# Patient Record
Sex: Female | Born: 1990 | Race: Black or African American | Hispanic: No | Marital: Married | Smoking: Never smoker
Health system: Southern US, Community
[De-identification: ages and names within clinical notes are randomized; demographics above are authoritative.]

## PROBLEM LIST (undated history)

## (undated) DIAGNOSIS — A6000 Herpesviral infection of urogenital system, unspecified: Secondary | ICD-10-CM

## (undated) DIAGNOSIS — F419 Anxiety disorder, unspecified: Secondary | ICD-10-CM

## (undated) DIAGNOSIS — L309 Dermatitis, unspecified: Secondary | ICD-10-CM

## (undated) DIAGNOSIS — O26619 Liver and biliary tract disorders in pregnancy, unspecified trimester: Secondary | ICD-10-CM

## (undated) DIAGNOSIS — N390 Urinary tract infection, site not specified: Secondary | ICD-10-CM

## (undated) DIAGNOSIS — K831 Obstruction of bile duct: Secondary | ICD-10-CM

## (undated) DIAGNOSIS — R519 Headache, unspecified: Secondary | ICD-10-CM

## (undated) DIAGNOSIS — O26649 Intrahepatic cholestasis of pregnancy, unspecified trimester: Secondary | ICD-10-CM

## (undated) HISTORY — PX: NO PAST SURGERIES: SHX2092

---

## 2019-01-01 ENCOUNTER — Ambulatory Visit (HOSPITAL_COMMUNITY): Payer: Medicaid Other | Admitting: Licensed Clinical Social Worker

## 2021-01-21 ENCOUNTER — Encounter (HOSPITAL_COMMUNITY): Payer: Self-pay | Admitting: Emergency Medicine

## 2021-01-21 ENCOUNTER — Inpatient Hospital Stay (HOSPITAL_COMMUNITY)
Admission: EM | Admit: 2021-01-21 | Discharge: 2021-01-21 | Disposition: A | Discharge status: Home / Self Care | Payer: Medicaid Other | Attending: Obstetrics and Gynecology | Admitting: Obstetrics and Gynecology

## 2021-01-21 ENCOUNTER — Other Ambulatory Visit: Payer: Self-pay

## 2021-01-21 ENCOUNTER — Inpatient Hospital Stay (HOSPITAL_COMMUNITY): Payer: Medicaid Other

## 2021-01-21 DIAGNOSIS — Z3A01 Less than 8 weeks gestation of pregnancy: Secondary | ICD-10-CM | POA: Diagnosis not present

## 2021-01-21 DIAGNOSIS — O26899 Other specified pregnancy related conditions, unspecified trimester: Secondary | ICD-10-CM

## 2021-01-21 DIAGNOSIS — O99351 Diseases of the nervous system complicating pregnancy, first trimester: Secondary | ICD-10-CM | POA: Diagnosis not present

## 2021-01-21 DIAGNOSIS — O3680X0 Pregnancy with inconclusive fetal viability, not applicable or unspecified: Secondary | ICD-10-CM

## 2021-01-21 DIAGNOSIS — G43009 Migraine without aura, not intractable, without status migrainosus: Secondary | ICD-10-CM | POA: Insufficient documentation

## 2021-01-21 DIAGNOSIS — Z885 Allergy status to narcotic agent status: Secondary | ICD-10-CM | POA: Insufficient documentation

## 2021-01-21 DIAGNOSIS — R519 Headache, unspecified: Secondary | ICD-10-CM | POA: Diagnosis present

## 2021-01-21 DIAGNOSIS — O418X10 Other specified disorders of amniotic fluid and membranes, first trimester, not applicable or unspecified: Secondary | ICD-10-CM

## 2021-01-21 DIAGNOSIS — O208 Other hemorrhage in early pregnancy: Secondary | ICD-10-CM | POA: Insufficient documentation

## 2021-01-21 DIAGNOSIS — R109 Unspecified abdominal pain: Secondary | ICD-10-CM

## 2021-01-21 HISTORY — DX: Herpesviral infection of urogenital system, unspecified: A60.00

## 2021-01-21 LAB — COMPREHENSIVE METABOLIC PANEL
ALT: 16 U/L (ref 0–44)
AST: 17 U/L (ref 15–41)
Albumin: 4.1 g/dL (ref 3.5–5.0)
Alkaline Phosphatase: 49 U/L (ref 38–126)
Anion gap: 7 (ref 5–15)
BUN: 7 mg/dL (ref 6–20)
CO2: 23 mmol/L (ref 22–32)
Calcium: 9.3 mg/dL (ref 8.9–10.3)
Chloride: 103 mmol/L (ref 98–111)
Creatinine, Ser: 0.74 mg/dL (ref 0.44–1.00)
GFR, Estimated: >60 mL/min (ref >60)
Glucose, Bld: 84 mg/dL (ref 70–99)
Potassium: 3.6 mmol/L (ref 3.5–5.1)
Sodium: 133 mmol/L — ABNORMAL LOW (ref 135–145)
Total Bilirubin: 0.7 mg/dL (ref 0.3–1.2)
Total Protein: 7.2 g/dL (ref 6.5–8.1)

## 2021-01-21 LAB — CBC WITH DIFFERENTIAL/PLATELET
Abs Immature Granulocytes: 0 10*3/uL (ref 0.00–0.07)
Basophils Absolute: 0 10*3/uL (ref 0.0–0.1)
Basophils Relative: 0 %
Eosinophils Absolute: 0.1 10*3/uL (ref 0.0–0.5)
Eosinophils Relative: 1 %
HCT: 34.8 % — ABNORMAL LOW (ref 36.0–46.0)
Hemoglobin: 12.1 g/dL (ref 12.0–15.0)
Immature Granulocytes: 0 %
Lymphocytes Relative: 41 %
Lymphs Abs: 2 10*3/uL (ref 0.7–4.0)
MCH: 33 pg (ref 26.0–34.0)
MCHC: 34.8 g/dL (ref 30.0–36.0)
MCV: 94.8 fL (ref 80.0–100.0)
Monocytes Absolute: 0.5 10*3/uL (ref 0.1–1.0)
Monocytes Relative: 11 %
Neutro Abs: 2.3 10*3/uL (ref 1.7–7.7)
Neutrophils Relative %: 47 %
Platelets: 251 10*3/uL (ref 150–400)
RBC: 3.67 MIL/uL — ABNORMAL LOW (ref 3.87–5.11)
RDW: 11.9 % (ref 11.5–15.5)
WBC: 4.9 10*3/uL (ref 4.0–10.5)
nRBC: 0 % (ref 0.0–0.2)

## 2021-01-21 LAB — URINALYSIS, ROUTINE W REFLEX MICROSCOPIC
Bilirubin Urine: NEGATIVE
Glucose, UA: NEGATIVE mg/dL
Hgb urine dipstick: NEGATIVE
Ketones, ur: NEGATIVE mg/dL
Nitrite: NEGATIVE
Protein, ur: NEGATIVE mg/dL
Specific Gravity, Urine: 1.015 (ref 1.005–1.030)
pH: 6 (ref 5.0–8.0)

## 2021-01-21 LAB — URINALYSIS, MICROSCOPIC (REFLEX): RBC / HPF: NONE SEEN RBC/hpf (ref 0–5)

## 2021-01-21 LAB — WET PREP, GENITAL
Clue Cells Wet Prep HPF POC: NONE SEEN
Sperm: NONE SEEN
Trich, Wet Prep: NONE SEEN
Yeast Wet Prep HPF POC: NONE SEEN

## 2021-01-21 LAB — I-STAT BETA HCG BLOOD, ED (MC, WL, AP ONLY): I-stat hCG, quantitative: >2000 m[IU]/mL — ABNORMAL HIGH (ref <5)

## 2021-01-21 LAB — HCG, QUANTITATIVE, PREGNANCY: hCG, Beta Chain, Quant, S: 4380 m[IU]/mL — ABNORMAL HIGH (ref <5)

## 2021-01-21 LAB — ABO/RH: ABO/RH(D): O POS

## 2021-01-21 LAB — LIPASE, BLOOD: Lipase: 30 U/L (ref 11–51)

## 2021-01-21 LAB — HIV ANTIBODY (ROUTINE TESTING W REFLEX): HIV Screen 4th Generation wRfx: NONREACTIVE

## 2021-01-21 MED ORDER — BUTALBITAL-APAP-CAFFEINE 50-325-40 MG PO TABS
2.0000 | ORAL_TABLET | Freq: Once | ORAL | Status: AC
  Administered 2021-01-21: 2 via ORAL
  Filled 2021-01-21: qty 2

## 2021-01-21 MED ORDER — ONDANSETRON 4 MG PO TBDP
4.0000 mg | ORAL_TABLET | Freq: Once | ORAL | Status: DC
  Filled 2021-01-21: qty 1

## 2021-01-21 MED ORDER — BUTALBITAL-APAP-CAFFEINE 50-325-40 MG PO TABS: 1.0000 | ORAL_TABLET | Freq: Four times a day (QID) | ORAL | 0 refills | Status: DC | PRN

## 2021-01-21 NOTE — ED Triage Notes (Signed)
Pt endorses migraine for 4 days and abd x1 week.

## 2021-01-21 NOTE — MAU Note (Signed)
Gloria Mann is a 30 y.o. at [redacted]w[redacted]d here in MAU reporting: for the past week has had a pulling pain down the middle of her abdomen. Also has had a migraine for the past 4 days. No bleeding or discharge.   LMP: 12/25/2020  Onset of complaint: ongoing  Pain score: abdomen 7/10, head 8/10  Vitals:   01/21/21 1339 01/21/21 1452  BP: 100/69 110/63  Pulse: 99 83  Resp: 16 16  Temp: 99.6 F (37.6 C) 98 F (36.7 C)  SpO2: 100% 99%     Lab orders placed from triage: none

## 2021-01-21 NOTE — ED Provider Notes (Addendum)
Emergency Medicine Provider Triage Evaluation Note  Gloria Mann , a 30 y.o. female  was evaluated in triage.  Pt complains of headache and abdominal pain.  Headache started 4 days ago, comes and goes.  Associated with nausea and worsened by light.  History of similar migraines, this 1 is different and that its across both temples as opposed to just unilateral.  No changes in vision.  Also complains of abdominal pain has been ongoing for 1 week.  Suprapubic, comes and goes.  No urinary symptoms..  Review of Systems  Positive: Headache, abdominal pain Negative: Vision changes, urinary symptoms  Physical Exam  BP 100/69 (BP Location: Left Arm)   Pulse 99   Temp 99.6 F (37.6 C)   Resp 16   SpO2 100%  Gen:   Awake, no distress   Resp:  Normal effort  MSK:   Moves extremities without difficulty  Other:  Abdomen is soft, mild tenderness over the suprapubic area.  Medical Decision Making  Medically screening exam initiated at 1:44 PM.  Appropriate orders placed.  Gloria Mann was informed that the remainder of the evaluation will be completed by another provider, this initial triage assessment does not replace that evaluation, and the importance of remaining in the ED until their evaluation is complete.  Will check abdominal labs.  Classic migraine presentation, no red flags.  Do not think CT is needed.  Patient is pregnant.  We will call MAU.  No vaginal bleeding.   Theron Arista, PA-C 01/21/21 1346    Jacalyn Lefevre, MD 01/21/21 1413    Theron Arista, PA-C 01/21/21 1424    Jacalyn Lefevre, MD 01/21/21 1510

## 2021-01-21 NOTE — MAU Provider Note (Signed)
History     CSN: [id number]  Arrival date and time: 01/21/21 1331   Event Date/Time   First Provider Initiated Contact with Patient 01/21/21 1542      Chief Complaint  Patient presents with   Migraine   Abdominal Pain   HPI  [name] is a 30 y.o. female [phone] @ [redacted]w[redacted]d here in MAU with migraine HA X 5 days. She does have a history of migraines. She has pain in both sides of her temple. The migraine is constant, the pain in her temple only hurts when she touches it. She tried taking sudafed which normally works, and this time it did not work. She currently rates her pain 8/10.   She also reports a pulling pain in the middle of her abdomen. At times the pain radiates down to her pelvis. She rates her abdominal pain 8/10. She has not taken anything for the pain. She has no bleeding. She was transferred her from the ED for workup.   OB History     Gravida  5   Para  2   Term      Preterm  2   AB  2   Living  2      SAB  1   IAB  1   Ectopic      Multiple      Live Births  2           Past Medical History:  Diagnosis Date   Herpes genitalis     Past Surgical History:  Procedure Laterality Date   NO PAST SURGERIES      No family history on file.  Social History   Tobacco Use   Smoking status: Never   Smokeless tobacco: Never  Substance Use Topics   Alcohol use: Not Currently   Drug use: Never    Allergies:  Allergies  Allergen Reactions   Morphine And Related     No medications prior to admission.   Results for orders placed or performed during the hospital encounter of 01/21/21 (from the past 48 hour(s))  CBC with Differential     Status: Abnormal   Collection Time: 01/21/21  1:45 PM  Result Value Ref Range   WBC 4.9 4.0 - 10.5 K/uL   RBC 3.67 (L) 3.87 - 5.11 MIL/uL   Hemoglobin 12.1 12.0 - 15.0 g/dL   HCT 60.1 (L) 09.3 - 23.5 %   MCV 94.8 80.0 - 100.0 fL   MCH 33.0 26.0 - 34.0 pg   MCHC 34.8 30.0 - 36.0 g/dL   RDW 57.3  22.0 - 25.4 %   Platelets 251 150 - 400 K/uL   nRBC 0.0 0.0 - 0.2 %   Neutrophils Relative % 47 %   Neutro Abs 2.3 1.7 - 7.7 K/uL   Lymphocytes Relative 41 %   Lymphs Abs 2.0 0.7 - 4.0 K/uL   Monocytes Relative 11 %   Monocytes Absolute 0.5 0.1 - 1.0 K/uL   Eosinophils Relative 1 %   Eosinophils Absolute 0.1 0.0 - 0.5 K/uL   Basophils Relative 0 %   Basophils Absolute 0.0 0.0 - 0.1 K/uL   Immature Granulocytes 0 %   Abs Immature Granulocytes 0.00 0.00 - 0.07 K/uL    Comment: Performed at [facility], [address], [place], [place] [address]  Comprehensive metabolic panel     Status: Abnormal   Collection Time: 01/21/21  1:45 PM  Result Value Ref Range   Sodium 133 (  L) 135 - 145 mmol/L   Potassium 3.6 3.5 - 5.1 mmol/L   Chloride 103 98 - 111 mmol/L   CO2 23 22 - 32 mmol/L   Glucose, Bld 84 70 - 99 mg/dL    Comment: Glucose reference range applies only to samples taken after fasting for at least 8 hours.   BUN 7 6 - 20 mg/dL   Creatinine, Ser 7.56 0.44 - 1.00 mg/dL   Calcium 9.3 8.9 - 43.3 mg/dL   Total Protein 7.2 6.5 - 8.1 g/dL   Albumin 4.1 3.5 - 5.0 g/dL   AST 17 15 - 41 U/L   ALT 16 0 - 44 U/L   Alkaline Phosphatase 49 38 - 126 U/L   Total Bilirubin 0.7 0.3 - 1.2 mg/dL   GFR, Estimated >29 >51 mL/min    Comment: (NOTE) Calculated using the CKD-EPI Creatinine Equation (2021)    Anion gap 7 5 - 15    Comment: Performed at [facility], [address], [place], [place] [address]  Lipase, blood     Status: None   Collection Time: 01/21/21  1:45 PM  Result Value Ref Range   Lipase 30 11 - 51 U/L    Comment: Performed at [facility], [address] [place], [place] [address]  I-Stat beta hCG blood, ED     Status: Abnormal   Collection Time: 01/21/21  2:09 PM  Result Value Ref Range   I-stat hCG, quantitative >2,000.0 (H) <5 mIU/mL   Comment 3            Comment:   GEST. AGE      CONC.  (mIU/mL)   <=1 WEEK        5 - 50     2 WEEKS       50 -  500     3 WEEKS       100 - 10,000     4 WEEKS     1,000 - 30,000        FEMALE AND NON-PREGNANT FEMALE:     LESS THAN 5 mIU/mL   Urinalysis, Routine w reflex microscopic     Status: Abnormal   Collection Time: 01/21/21  2:21 PM  Result Value Ref Range   Color, Urine YELLOW YELLOW   APPearance CLEAR CLEAR   Specific Gravity, Urine 1.015 1.005 - 1.030   pH 6.0 5.0 - 8.0   Glucose, UA NEGATIVE NEGATIVE mg/dL   Hgb urine dipstick NEGATIVE NEGATIVE   Bilirubin Urine NEGATIVE NEGATIVE   Ketones, ur NEGATIVE NEGATIVE mg/dL   Protein, ur NEGATIVE NEGATIVE mg/dL   Nitrite NEGATIVE NEGATIVE   Leukocytes,Ua SMALL (A) NEGATIVE    Comment: Performed at [facility], [address] [place], [place] [address]  Urinalysis, Microscopic (reflex)     Status: Abnormal   Collection Time: 01/21/21  2:21 PM  Result Value Ref Range   RBC / HPF NONE SEEN 0 - 5 RBC/hpf   WBC, UA 0-5 0 - 5 WBC/hpf   Bacteria, UA FEW (A) NONE SEEN   Squamous Epithelial / LPF 11-20 0 - 5    Comment: Performed at [facility], [address] [place], [place] [address]  hCG, quantitative, pregnancy     Status: Abnormal   Collection Time: 01/21/21  3:48 PM  Result Value Ref Range   hCG, Beta Chain, Quant, S 4,380 (H) <5 mIU/mL    Comment:  GEST. AGE      CONC.  (mIU/mL)   <=1 WEEK        5 - 50     2 WEEKS       50 - 500     3 WEEKS       100 - 10,000     4 WEEKS     1,000 - 30,000     5 WEEKS     3,500 - 115,000   6-8 WEEKS     12,000 - 270,000    12 WEEKS     15,000 - 220,000        FEMALE AND NON-PREGNANT FEMALE:     LESS THAN 5 mIU/mL Performed at [facility], [address] [place], [place] [address]   ABO/Rh     Status: None   Collection Time: 01/21/21  4:00 PM  Result Value Ref Range   ABO/RH(D) O POS    No rh immune globuloin      NOT A RH IMMUNE GLOBULIN CANDIDATE, PT RH POSITIVE Performed at [facility], [address] [place], [place] [address]   Wet prep, genital      Status: Abnormal   Collection Time: 01/21/21  4:04 PM   Specimen: Vaginal  Result Value Ref Range   Yeast Wet Prep HPF POC NONE SEEN NONE SEEN   Trich, Wet Prep NONE SEEN NONE SEEN   Clue Cells Wet Prep HPF POC NONE SEEN NONE SEEN   WBC, Wet Prep HPF POC MANY (A) NONE SEEN   Sperm NONE SEEN     Comment: Performed at [facility], [address], [place], [place] [address]    US OB LESS THAN 14 WEEKS WITH OB TRANSVAGINAL  Result Date: 01/21/2021 CLINICAL DATA:  Leslie Dales pelvic pain for 1 week EXAM: OBSTETRIC <14 WK [place] AND TRANSVAGINAL OB US TECHNIQUE: Both transabdominal and transvaginal ultrasound examinations were performed for complete evaluation of the gestation as well as the maternal uterus, adnexal regions, and pelvic cul-de-sac. Transvaginal technique was performed to assess early pregnancy. COMPARISON:  None. FINDINGS: Intrauterine gestational sac: Single Yolk sac:  Not Visualized. Embryo:  Not Visualized. Cardiac Activity: Not Visualized. MSD: 4.8 mm   5 w   1 d Subchorionic hemorrhage: Moderate subchorionic hemorrhage measuring 2.1 x 1.0 x 0.6 cm. Maternal uterus/adnexae: Right ovary measures 2.5 x 3.5 x 2.0 cm and the left ovary measures 3.2 x 4.0 x 3.1 cm. Corpus luteum cyst left ovary. Trace pelvic free fluid. IMPRESSION: 1. Probable early intrauterine gestational sac, but no yolk sac, fetal pole, or cardiac activity yet visualized. Recommend follow-up quantitative B-HCG levels and follow-up US in 14 days to assess viability. This recommendation follows SRU consensus guidelines: Diagnostic Criteria for Nonviable Pregnancy Early in the First Trimester. Malva Limes Med 2013; 323:5573-22. 2. Moderate subchorionic hemorrhage. Electronically Signed   By: Sharlet Salina M.D.   On: 01/21/2021 17:51     Review of Systems  Gastrointestinal:  Positive for nausea and vomiting.  Neurological:  Positive for headaches.  Physical Exam   Blood pressure 110/63, pulse 83, temperature 98 F (36.7  C), temperature source Oral, resp. rate 16, height 5\' 3"  (1.6 m), weight 60.1 kg, last menstrual period 12/25/2020, SpO2 99 %.  Physical Exam Constitutional:      General: She is not in acute distress.    Appearance: She is well-developed. She is not ill-appearing, toxic-appearing or diaphoretic.  HENT:  Head: Normocephalic.  Abdominal:     Palpations: Abdomen is soft.     Tenderness: There is generalized abdominal tenderness. There is no guarding or rebound.  Skin:    General: Skin is warm.  Neurological:     Mental Status: She is alert and oriented to person, place, and time.     GCS: GCS eye subscore is 4. GCS verbal subscore is 5. GCS motor subscore is 6.     Motor: Motor function is intact.   MAU Course  Procedures None  MDM  Wet prep & GC HIV, CBC, Hcg, ABO US OB transvaginal   Fioricet given 2 tablets, pain is now 0/10  Assessment and Plan   A:  Pregnancy of unknown anatomic location - Plan: Discharge patient  Abdominal pain in pregnancy - Plan: US OB LESS THAN 14 WEEKS WITH OB TRANSVAGINAL, US OB LESS THAN 14 WEEKS WITH OB TRANSVAGINAL, Discharge patient  [redacted] weeks gestation of pregnancy - Plan: Discharge patient  Subchorionic hemorrhage of placenta in first trimester, single or unspecified fetus - Plan: Discharge patient  Migraine without aura and without status migrainosus, not intractable - Plan: Discharge patient    P:  Discharge home in stable condition Return to MAU if symptoms worsen Ectopic precautions Return to MAU Friday evening for Stat quant Pelvic rest Reviewed [place] details with patient.     Duane Lope, NP 01/21/2021 6:29 PM

## 2021-01-22 LAB — GC/CHLAMYDIA PROBE AMP (~~LOC~~) NOT AT ARMC
Chlamydia: NEGATIVE
Comment: NEGATIVE
Comment: NORMAL
Neisseria Gonorrhea: NEGATIVE

## 2021-01-23 ENCOUNTER — Inpatient Hospital Stay (HOSPITAL_COMMUNITY)
Admission: AD | Admit: 2021-01-23 | Discharge: 2021-01-23 | Disposition: A | Discharge status: Home / Self Care | Payer: Medicaid Other | Attending: Obstetrics & Gynecology | Admitting: Obstetrics & Gynecology

## 2021-01-23 DIAGNOSIS — Z3A01 Less than 8 weeks gestation of pregnancy: Secondary | ICD-10-CM | POA: Insufficient documentation

## 2021-01-23 DIAGNOSIS — Z3491 Encounter for supervision of normal pregnancy, unspecified, first trimester: Secondary | ICD-10-CM | POA: Diagnosis not present

## 2021-01-23 DIAGNOSIS — Z3490 Encounter for supervision of normal pregnancy, unspecified, unspecified trimester: Secondary | ICD-10-CM

## 2021-01-23 DIAGNOSIS — O3680X0 Pregnancy with inconclusive fetal viability, not applicable or unspecified: Secondary | ICD-10-CM | POA: Diagnosis not present

## 2021-01-23 DIAGNOSIS — Z3687 Encounter for antenatal screening for uncertain dates: Secondary | ICD-10-CM

## 2021-01-23 LAB — HCG, QUANTITATIVE, PREGNANCY: hCG, Beta Chain, Quant, S: 11423 m[IU]/mL — ABNORMAL HIGH (ref <5)

## 2021-01-23 NOTE — MAU Note (Signed)
PT SAYS SHE WAS HERE ON 12TH- FOR  SHE HAD WENT TO [facility] FOR H/A AND ABD PAIN SENT HER HERE- HAD U/S  TOLD TO  BACK TODAY AFTER 5 - FOR LABS NOW- ABD PAIN IS 5. WAS 8 NO VB

## 2021-01-23 NOTE — MAU Provider Note (Signed)
None     S Ms. [place] Clayburn is a 30 y.o. [phone] patient who presents to MAU today for repeat hCG.  Patient reports some mild cramping, but is otherwise without complaint.  She states she is unsure of her LMP as she has had irregular bleeding for the past 2 months.   O BP 107/65 (BP Location: Right Arm)   Pulse 79   Temp 98.2 F (36.8 C)   Ht 5\' 3"  (1.6 m)   Wt 61.7 kg   LMP 12/25/2020   BMI 24.09 kg/m  Physical Exam Vitals reviewed.  Constitutional:      Appearance: Normal appearance.  HENT:     Head: Normocephalic and atraumatic.  Eyes:     Conjunctiva/sclera: Conjunctivae normal.  Cardiovascular:     Rate and Rhythm: Normal rate.  Pulmonary:     Effort: Pulmonary effort is normal. No respiratory distress.  Musculoskeletal:        General: Normal range of motion.     Cervical back: Normal range of motion.  Neurological:     Mental Status: She is alert and oriented to person, place, and time.  Psychiatric:        Mood and Affect: Mood normal.        Behavior: Behavior normal.        Thought Content: Thought content normal.   Results for orders placed or performed during the hospital encounter of 01/23/21 (from the past 24 hour(s))  hCG, quantitative, pregnancy     Status: Abnormal   Collection Time: 01/23/21  7:23 PM  Result Value Ref Range   hCG, Beta Chain, Quant, S 11,423 (H) <5 mIU/mL     A Medical screening exam complete Labs Complete Appropriate Rise in hCG  P -Patient informed of hCG levels. -Reassured that some mild cramping is anticipated during early stages of pregnancy. -Instructed to monitor. -Discussed need for follow up 01/25/21 in 2-3 weeks. -Outpatient order placed for OBTV US. -Patient given list of community providers to establish [facility].  -Encouraged to call primary office or return to MAU if symptoms worsen or with the onset of new symptoms. -Discharged to home in stable condition.   [facility], CNM 01/23/2021 9:17 PM

## 2021-01-23 NOTE — Discharge Instructions (Signed)
  [facility] Providers          [facility]  [address], [place], [place] [address]  [phone]  [facility]  [address], [place], [place] [address]  [phone]  [facility]  [address], [place], [place] [address]  [phone]  [facility] [place]  [address], [place], [place] [address]  [phone]  [facility]  [address]), [place], [place] [address]  [phone]  [facility] [address], [place], [place] [address] [phone]  [facility]  [address], [place], [place] [address]  [phone]  [facility]  [address], [place], [place] [address]  [phone]  [facility]  [address], [place], [place] [address]  [phone]  [facility]  [address], [place], [place] [address]  [phone]  [facility]  [address], [place], [place] [address]  [phone]  [facility]  [address], [place], [place] [address]  [phone]  [facility]   [address], [place], [place] [address]  [phone]  [facility]  [address], [place], [place] [address]   [phone]  [facility]  [address], [place],  [address]  [phone]         

## 2021-02-04 ENCOUNTER — Inpatient Hospital Stay (HOSPITAL_COMMUNITY): Payer: Medicaid Other

## 2021-02-04 ENCOUNTER — Inpatient Hospital Stay (HOSPITAL_COMMUNITY)
Admission: AD | Admit: 2021-02-04 | Discharge: 2021-02-04 | Disposition: A | Discharge status: Home / Self Care | Payer: Medicaid Other | Attending: Obstetrics and Gynecology | Admitting: Obstetrics and Gynecology

## 2021-02-04 ENCOUNTER — Other Ambulatory Visit: Payer: Self-pay

## 2021-02-04 ENCOUNTER — Encounter (HOSPITAL_COMMUNITY): Payer: Self-pay | Admitting: Obstetrics and Gynecology

## 2021-02-04 DIAGNOSIS — O219 Vomiting of pregnancy, unspecified: Secondary | ICD-10-CM | POA: Diagnosis not present

## 2021-02-04 DIAGNOSIS — Z885 Allergy status to narcotic agent status: Secondary | ICD-10-CM | POA: Diagnosis not present

## 2021-02-04 DIAGNOSIS — O26891 Other specified pregnancy related conditions, first trimester: Secondary | ICD-10-CM | POA: Diagnosis not present

## 2021-02-04 DIAGNOSIS — Z3A01 Less than 8 weeks gestation of pregnancy: Secondary | ICD-10-CM | POA: Insufficient documentation

## 2021-02-04 DIAGNOSIS — F4320 Adjustment disorder, unspecified: Secondary | ICD-10-CM | POA: Diagnosis not present

## 2021-02-04 DIAGNOSIS — O208 Other hemorrhage in early pregnancy: Secondary | ICD-10-CM | POA: Diagnosis not present

## 2021-02-04 DIAGNOSIS — O2341 Unspecified infection of urinary tract in pregnancy, first trimester: Secondary | ICD-10-CM | POA: Diagnosis not present

## 2021-02-04 DIAGNOSIS — O99341 Other mental disorders complicating pregnancy, first trimester: Secondary | ICD-10-CM

## 2021-02-04 DIAGNOSIS — O210 Mild hyperemesis gravidarum: Secondary | ICD-10-CM | POA: Diagnosis not present

## 2021-02-04 HISTORY — DX: Obstruction of bile duct: O26.619

## 2021-02-04 HISTORY — DX: Intrahepatic cholestasis of pregnancy, unspecified trimester: O26.649

## 2021-02-04 HISTORY — DX: Obstruction of bile duct: K83.1

## 2021-02-04 LAB — URINALYSIS, ROUTINE W REFLEX MICROSCOPIC
Bilirubin Urine: NEGATIVE
Glucose, UA: NEGATIVE mg/dL
Hgb urine dipstick: NEGATIVE
Ketones, ur: 20 mg/dL — AB
Nitrite: POSITIVE — AB
Protein, ur: NEGATIVE mg/dL
Specific Gravity, Urine: 1.011 (ref 1.005–1.030)
pH: 6 (ref 5.0–8.0)

## 2021-02-04 LAB — CBC
HCT: 36 % (ref 36.0–46.0)
Hemoglobin: 12.7 g/dL (ref 12.0–15.0)
MCH: 32.6 pg (ref 26.0–34.0)
MCHC: 35.3 g/dL (ref 30.0–36.0)
MCV: 92.3 fL (ref 80.0–100.0)
Platelets: 258 10*3/uL (ref 150–400)
RBC: 3.9 MIL/uL (ref 3.87–5.11)
RDW: 11.9 % (ref 11.5–15.5)
WBC: 6.1 10*3/uL (ref 4.0–10.5)
nRBC: 0 % (ref 0.0–0.2)

## 2021-02-04 LAB — HCG, QUANTITATIVE, PREGNANCY: hCG, Beta Chain, Quant, S: 88678 m[IU]/mL — ABNORMAL HIGH (ref <5)

## 2021-02-04 MED ORDER — SCOPOLAMINE 1 MG/3DAYS TD PT72
1.0000 | MEDICATED_PATCH | TRANSDERMAL | Status: DC
  Administered 2021-02-04: 1.5 mg via TRANSDERMAL
  Filled 2021-02-04: qty 1

## 2021-02-04 MED ORDER — METOCLOPRAMIDE HCL 10 MG PO TABS: 10.0000 mg | ORAL_TABLET | Freq: Four times a day (QID) | ORAL | 1 refills | Status: DC | PRN

## 2021-02-04 MED ORDER — METOCLOPRAMIDE HCL 10 MG PO TABS
10.0000 mg | ORAL_TABLET | Freq: Once | ORAL | Status: AC
  Administered 2021-02-04: 10 mg via ORAL
  Filled 2021-02-04: qty 1

## 2021-02-04 MED ORDER — SCOPOLAMINE 1 MG/3DAYS TD PT72
1.0000 | MEDICATED_PATCH | TRANSDERMAL | 1 refills | Status: DC
Start: 2021-02-07 — End: 2021-04-22

## 2021-02-04 MED ORDER — CEFADROXIL 500 MG PO CAPS: 500.0000 mg | ORAL_CAPSULE | Freq: Two times a day (BID) | ORAL | 0 refills | Status: DC
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
## 2021-02-04 NOTE — Discharge Instructions (Signed)
Psychiatric Services °[facility]  °[address], [place], [place] °[phone] ° °[facility] °[address], [place], [place] °Helpline [phone] or 1-800-711-2635 °www..com/[email or web address]/[email or web address]-health-hospital/ ° °[facility] °Walk-in Mon-Fri, 8:30-5:00 °[address], [place], [place] °[phone] °www.[email or web address].org  °*Bring your own interpreter at 1st visit ° °[facility] °[address], [place], [place] °[phone] °www.[email or web address].com  ° °[facility]  °[address], [place], [place] °[phone] ° °[facility] °Walk-in Mon-Fri, 8am-3pm °[address], [place], [place] °[phone] °www.[facility].org ° °Therapy/Counseling ° °[facility] °[address], [place], [place] °[phone] ° °[facility] °[address], [place], [place] °[phone] ° °[facility] °[address], [place], [place] °[phone] °*pacientes que hablen espanol, favor comunicarse con el Sr. [place], extension 2244 o la Sra Laurecki, extension 3331 para hacer una cita.  ° °[facility] °[address] [place]” °[phone] (Habla Espanol) ° °[facility] °[address], [place], [place] °[phone] ° °[facility] °[address], [place], [place] °[phone] °  °[facility]  °[address], [place], [place] °[phone] °www.[email or web address].org  °*Uninsured and underinsured, ages 19-64 ° °[facility] °[address], [place], [place] °[phone] (Habla Espanol) ° °[facility] °[address], [place], [place] °[phone] (Habla Espanol) ° °[facility] °[address], [place], [place] °[phone] (Habla  Espanol) ° °[facility] °Mon-Thurs 8:30am-8:00pm/ Fri 8:30-7:00pm °[address], [place], [place] °(3rd floor, located at corner of [address]) °Call [phone] to schedule an appointment °[email or web address] ° °[facility] °[address], [place], [place]  °[phone] ° °[facility]  °[address], [place], [place] °336-375-8333 ° °Social Support °[facility] ([facility]) °(336) 373-1402 or www.mhag.org °[address], [place], [place] [address] °* Recovery support and educational programs, including recovery skills classes, support groups, and one-on-one sessions with [place] Certified Peer Support Specialists.  ° ° °NAMI (National Alliance of the Mentally Ill) [place] °NAMI Helpline: (336) 370-4264 °* Family and Friends Support Group/  Contact Jack Glenn at [phone] for more information °* Family to Family Education Program and Basics Class : enroll online or email Mary Pennington at [email or web address]  °* Monthly educational meetings, contact Mitch McGee at [phone] °[email or web address].[email or web address]/  ° ° °24- Hour Availability:  °*[facility] 336-832-9700 or 1-800-711-2635 ° °* [facility] Crisis (Domestic Violence, Rape, Victim Assistance) Line 336-273-7273 ° °* [facility] 1-855-788-8787 or 336-676-6840 ° °* [facility] [place] [facility]  °336-899-1505(8am-4pm only) °1-866-261-5769(after hours) ° °*[facility] Mobile Crisis Unit °1-877-626-1772 ° °*[place] National Suicide Hotline °1-800-273-8255 ° ° °
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
## 2021-02-06 LAB — CULTURE, OB URINE: Culture: >=100000 — AB
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
## 2021-02-12 ENCOUNTER — Ambulatory Visit (INDEPENDENT_AMBULATORY_CARE_PROVIDER_SITE_OTHER): Payer: Medicaid Other | Admitting: Clinical

## 2021-02-12 ENCOUNTER — Other Ambulatory Visit: Payer: Self-pay

## 2021-02-12 DIAGNOSIS — F430 Acute stress reaction: Secondary | ICD-10-CM

## 2021-02-12 DIAGNOSIS — Z658 Other specified problems related to psychosocial circumstances: Secondary | ICD-10-CM

## 2021-02-12 NOTE — Patient Instructions (Signed)
Center for [facility] at [facility] [address] [place], [place] [address] [phone] (main office) [phone] [facility] office)  [email or web address] (view video tour of [facility], etc.)    [facility]  [phone] or [phone] [facility] 24/7  24/7 [facility], [address] [place], [place]  [phone] Fax: [phone] [email or web address] *Interpreters available *Accepts all insurance and uninsured for Urgent Care needs *Accepts Medicaid and uninsured for Outpatient treatment   NAMI [employer] on Mental Illness) [place] NAMI helpline: (639) 233-4480  NAMI [place] helpline: 463-812-3711 https://namiguilford.org  *A community hub for information relating to local resources and services for the friends and families of individuals living alongside a mental health condition, as well as the individuals themselves. Classes and support groups also provided

## 2021-02-16 NOTE — BH Specialist Note (Deleted)
Integrated Behavioral Health via Telemedicine Visit  02/16/2021 [place] [id number]  Number of Integrated Behavioral Health visits: *** Session Start time: 10:15***  Session End time: 10:45*** Total time: {IBH Total Time:21014050}  Referring Provider: *** Patient/Family location: Home*** [facility] Provider location: [facility]  All persons participating in visit: Patient *** and [facility] Hakeem Frazzini ***  Types of Service: {CHL AMB TYPE OF SERVICE:[phone]}  I connected with [place] Jaffee and/or [place] Bostick's {family members:[address]} via  Telephone or Engineer, civil (consulting)  (Video is Surveyor, mining) and verified that I am speaking with the correct person using two identifiers. Discussed confidentiality: {YES/NO:21197}  I discussed the limitations of telemedicine and the availability of in person appointments.  Discussed there is a possibility of technology failure and discussed alternative modes of communication if that failure occurs.  I discussed that engaging in this telemedicine visit, they consent to the provision of behavioral healthcare and the services will be billed under their insurance.  Patient and/or legal guardian expressed understanding and consented to Telemedicine visit: {YES/NO:21197}  Presenting Concerns: Patient and/or family reports the following symptoms/concerns: *** Duration of problem: ***; Severity of problem: {Mild/Moderate/Severe:20260}  Patient and/or Family's Strengths/Protective Factors: {CHL AMB BH PROTECTIVE [phone]}  Goals Addressed: Patient will:  Reduce symptoms of: {IBH Symptoms:21014056}   Increase knowledge and/or ability of: {IBH Patient Tools:21014057}   Demonstrate ability to: {IBH Goals:21014053}  Progress towards Goals: {CHL AMB BH PROGRESS TOWARDS GOALS:[phone]}  Interventions: Interventions utilized:  {IBH  Interventions:21014054} Standardized Assessments completed: {IBH Screening Tools:21014051}  Patient and/or Family Response: ***  Assessment: Patient currently experiencing ***.   Patient may benefit from ***.  Plan: Follow up with behavioral health clinician on : *** Behavioral recommendations: *** Referral(s): {IBH Referrals:21014055}  I discussed the assessment and treatment plan with the patient and/or parent/guardian. They were provided an opportunity to ask questions and all were answered. They agreed with the plan and demonstrated an understanding of the instructions.   They were advised to call back or seek an in-person evaluation if the symptoms worsen or if the condition fails to improve as anticipated.  Valetta Close Julie Paolini, LCSW

## 2021-02-17 ENCOUNTER — Other Ambulatory Visit: Payer: Self-pay | Admitting: *Deleted

## 2021-02-17 DIAGNOSIS — O219 Vomiting of pregnancy, unspecified: Secondary | ICD-10-CM

## 2021-02-17 MED ORDER — PROMETHAZINE HCL 25 MG PO TABS: 25.0000 mg | ORAL_TABLET | Freq: Four times a day (QID) | ORAL | 0 refills | Status: DC | PRN

## 2021-02-17 NOTE — Progress Notes (Signed)
Patient called requesting NOB appointment. Patient informed that she has an upcoming on 03/09/21, however patient did not know about appointment. Appointment rescheduled for 02/23/21 for NOB intake. Patient would be [redacted]w[redacted]d. Genetic testing will not be completed until NOB visit with provider.   Patient reported having a lot of nausea and vomiting with weight loss. The medication that was prescribed per patient is not working. Phenergan 25 mg 1 tab PO every 6 hours PRN sent to pharmacy.   Clovis Pu, RN

## 2021-02-23 ENCOUNTER — Other Ambulatory Visit: Payer: Self-pay

## 2021-02-23 ENCOUNTER — Ambulatory Visit (INDEPENDENT_AMBULATORY_CARE_PROVIDER_SITE_OTHER): Payer: Medicaid Other

## 2021-02-23 VITALS — BP 112/68 | HR 88 | Temp 98.3°F | Wt 129.0 lb

## 2021-02-23 DIAGNOSIS — O09899 Supervision of other high risk pregnancies, unspecified trimester: Secondary | ICD-10-CM | POA: Insufficient documentation

## 2021-02-23 DIAGNOSIS — Z3480 Encounter for supervision of other normal pregnancy, unspecified trimester: Secondary | ICD-10-CM | POA: Insufficient documentation

## 2021-02-23 DIAGNOSIS — Z3A09 9 weeks gestation of pregnancy: Secondary | ICD-10-CM

## 2021-02-23 MED ORDER — BLOOD PRESSURE KIT DEVI: 0 refills | Status: DC

## 2021-02-23 MED ORDER — PREPLUS 27-1 MG PO TABS: 1.0000 | ORAL_TABLET | Freq: Every day | ORAL | 12 refills | Status: AC

## 2021-02-23 NOTE — Progress Notes (Signed)
    I discussed the limitations, risks, security and privacy concerns of performing an evaluation and management service by telephone and the availability of in person appointments. I also discussed with the patient that there may be a patient responsible charge related to this service. The patient expressed understanding and agreed to proceed.   Location: [facility] Patient: clinic Provider: clinic PRENATAL INTAKE SUMMARY  Ms. Pickard presents today New OB Nurse Interview.  OB History     Gravida  5   Para  2   Term  0   Preterm  2   AB  2   Living  2      SAB  1   IAB  1   Ectopic  0   Multiple      Live Births  2        Obstetric Comments  Daughter was born at 17 weeks pt had ICP Son was born at 72 weeks pt had ICP  Both born at [facility] in [place]         I have reviewed the patient's medical, obstetrical, social, and family histories, medications, and available lab results.  SUBJECTIVE She has no unusual complaints  OBJECTIVE Initial Nurse interview for history/labs (New OB)  last menstrual period date: 12/16/2020 EDD: 09/22/2021  GA: [redacted]w[redacted]d G5P0222 FHT: 133  GENERAL APPEARANCE: alert, well appearing, in no apparent distress, oriented to person, place and time   ASSESSMENT Normal pregnancy  PLAN Prenatal care:  [facility]  OB Pnl/HIV/Hep C OB Urine Culture GC/CT will obtain at next apt  PAP was obtained Jan. 2022 Medical records from [facility] to be released to clinic  HgbEval/SMA/CF ([place])-Done at next visit  Panorama-Done at next  A1C OB Ultrasound for anatomy ordered   Blood Pressure Monitor/Weight Scale BP Rx sent to [facility] Weight Scale pt to get one   MyChart/Babyscripts MyChart access verified. I explained pt will have some visits in office and some virtually. Babyscripts instructions given and order placed.   Placed OB Box on problem list and updated   Followup with Raelyn Mora, CNM  on 03/12/2021  Follow Up Instructions:   I discussed the assessment and treatment plan with the patient. The patient was provided an opportunity to ask questions and all were answered. The patient agreed with the plan and demonstrated an understanding of the instructions.   The patient was advised to call back or seek an in-person evaluation if the symptoms worsen or if the condition fails to improve as anticipated.  I provided 40 minutes of  face-to-face time during this encounter.  Gearldine Shown, CMA

## 2021-02-23 NOTE — Patient Instructions (Signed)
 Please remember that if you are not able to keep your appointment to call the office and reschedule, or cancel. If you no-show or cancel with less than 24 hour notice we will charge you, not your insurance a $50 fee.   AREA PEDIATRIC/FAMILY PRACTICE PHYSICIANS  [facility] [address] [place], [place] [address] Phone - [phone]   Fax - [phone]  [facility] [address] [place], [place]  [address] Phone - [phone]   Fax - [phone]  [facility] [address] [place], [place]  [address] Phone - [phone]   Fax - [phone]  [facility] [address] [place], [place]  [address] Phone - [phone]   Fax - [phone]  [facility] [address] [place], [place]  [address] Phone - [phone]   Fax - [phone]  [facility] [address] [place], [place]  [address] Phone - [phone]   Fax - [phone]  [facility] [place] [facility] [address] [place], [place]  [address] Phone - [phone]   Fax - [phone]  [facility] [address] [place], [place]  [address] Phone - [phone]   Fax - [phone]  [facility] [address] [place], [place]  [address] Phone - [phone]   Fax - [phone] [facility] [address] [place], [place]  [address] Phone - [phone]   Fax - [phone]  [facility] [address] [place], [place]  [address] Phone - [phone]   Fax - [phone]  [facility] [address] [place], [place]  [address] Phone - [phone]   Fax - [phone]  [facility] [address] [place], [place]  [address] Phone - [phone]   Fax - [phone]  [facility] [address] [place], [place]  [address] Phone - [phone]  [facility] [address] [place], [place]  [address] Phone - [phone]   Fax - [phone]  [facility] [address] [place], [place]  [address] Phone - [phone]   Fax - [phone]  [facility] [address] [place], [place]  [address] Phone - [phone]   Fax - [phone]  [facility] [address] [place], [place]  [address] Phone - [phone]   Fax - [phone]   [facility] [address] [place], [place]  [address] Phone - [phone]   Fax - [phone]  [facility] [address] [place], [place]  [address] Phone - [phone]   Fax - [phone]  [facility] [address] [place], [place]  [address] Phone - [phone]   Fax - [phone]  [facility] [address] , [place]  [address] Phone - [phone]   Fax - [phone]  [facility] [address] , [place]  [address] Phone - [phone]   Fax - [phone] [facility] [address] [place], [place]  [address] Phone - [phone]   Fax - [phone]  [facility] [address] [place], [place]  [address] Phone - [phone]   Fax - [phone]   [facility] [address] [place],     [address] Phone - [phone]   Fax - [phone]

## 2021-02-24 LAB — HEMOGLOBIN A1C
Est. average glucose Bld gHb Est-mCnc: 91 mg/dL
Hgb A1c MFr Bld: 4.8 % (ref 4.8–5.6)
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
## 2021-02-28 LAB — URINE CULTURE, OB REFLEX

## 2021-02-28 LAB — CULTURE, OB URINE

## 2021-03-04 ENCOUNTER — Other Ambulatory Visit: Payer: Self-pay | Admitting: *Deleted

## 2021-03-04 DIAGNOSIS — O2341 Unspecified infection of urinary tract in pregnancy, first trimester: Secondary | ICD-10-CM

## 2021-03-04 MED ORDER — CEFADROXIL 500 MG PO CAPS: 500.0000 mg | ORAL_CAPSULE | Freq: Two times a day (BID) | ORAL | 0 refills | Status: AC

## 2021-03-12 ENCOUNTER — Encounter: Payer: Medicaid Other | Admitting: Obstetrics and Gynecology

## 2021-03-12 DIAGNOSIS — Z3A12 12 weeks gestation of pregnancy: Secondary | ICD-10-CM

## 2021-03-12 DIAGNOSIS — O09899 Supervision of other high risk pregnancies, unspecified trimester: Secondary | ICD-10-CM

## 2021-03-13 ENCOUNTER — Ambulatory Visit: Payer: Medicaid Other | Admitting: Clinical

## 2021-03-13 DIAGNOSIS — Z658 Other specified problems related to psychosocial circumstances: Secondary | ICD-10-CM

## 2021-03-13 DIAGNOSIS — F430 Acute stress reaction: Secondary | ICD-10-CM

## 2021-03-13 NOTE — Patient Instructions (Signed)
Center for [facility] at [facility] [address] [place], [place] [address] [phone] (main office) [phone] office)   [facility] Department of Social Services  [facility]  [address] [place], [place] [address] [facility] Office [address] [place], [place] [address]   LIEAP (Low Income Energy Assistance Program) LowBlog.nl  [employer] (Low Income Public house manager Program) [email or web address]

## 2021-03-17 ENCOUNTER — Encounter (HOSPITAL_COMMUNITY): Payer: Self-pay | Admitting: Family Medicine

## 2021-03-17 ENCOUNTER — Inpatient Hospital Stay (HOSPITAL_COMMUNITY)
Admission: AD | Admit: 2021-03-17 | Discharge: 2021-03-17 | Disposition: A | Discharge status: Home / Self Care | Payer: Medicaid Other | Attending: Family Medicine | Admitting: Family Medicine

## 2021-03-17 ENCOUNTER — Other Ambulatory Visit: Payer: Self-pay

## 2021-03-17 DIAGNOSIS — Z3A13 13 weeks gestation of pregnancy: Secondary | ICD-10-CM | POA: Diagnosis not present

## 2021-03-17 DIAGNOSIS — O09899 Supervision of other high risk pregnancies, unspecified trimester: Secondary | ICD-10-CM

## 2021-03-17 DIAGNOSIS — H53149 Visual discomfort, unspecified: Secondary | ICD-10-CM | POA: Diagnosis not present

## 2021-03-17 DIAGNOSIS — O99891 Other specified diseases and conditions complicating pregnancy: Secondary | ICD-10-CM | POA: Insufficient documentation

## 2021-03-17 DIAGNOSIS — O99351 Diseases of the nervous system complicating pregnancy, first trimester: Secondary | ICD-10-CM | POA: Insufficient documentation

## 2021-03-17 DIAGNOSIS — O219 Vomiting of pregnancy, unspecified: Secondary | ICD-10-CM | POA: Insufficient documentation

## 2021-03-17 DIAGNOSIS — R1030 Lower abdominal pain, unspecified: Secondary | ICD-10-CM | POA: Insufficient documentation

## 2021-03-17 DIAGNOSIS — Z3480 Encounter for supervision of other normal pregnancy, unspecified trimester: Secondary | ICD-10-CM

## 2021-03-17 DIAGNOSIS — G43009 Migraine without aura, not intractable, without status migrainosus: Secondary | ICD-10-CM | POA: Diagnosis not present

## 2021-03-17 DIAGNOSIS — F1291 Cannabis use, unspecified, in remission: Secondary | ICD-10-CM | POA: Diagnosis not present

## 2021-03-17 DIAGNOSIS — M545 Low back pain, unspecified: Secondary | ICD-10-CM | POA: Diagnosis not present

## 2021-03-17 DIAGNOSIS — R748 Abnormal levels of other serum enzymes: Secondary | ICD-10-CM | POA: Insufficient documentation

## 2021-03-17 DIAGNOSIS — R42 Dizziness and giddiness: Secondary | ICD-10-CM | POA: Insufficient documentation

## 2021-03-17 DIAGNOSIS — O26891 Other specified pregnancy related conditions, first trimester: Secondary | ICD-10-CM | POA: Insufficient documentation

## 2021-03-17 HISTORY — DX: Headache, unspecified: R51.9

## 2021-03-17 LAB — URINALYSIS, MICROSCOPIC (REFLEX)

## 2021-03-17 LAB — COMPREHENSIVE METABOLIC PANEL
ALT: 116 U/L — ABNORMAL HIGH (ref 0–44)
AST: 56 U/L — ABNORMAL HIGH (ref 15–41)
Albumin: 3.6 g/dL (ref 3.5–5.0)
Alkaline Phosphatase: 65 U/L (ref 38–126)
Anion gap: 7 (ref 5–15)
BUN: 5 mg/dL — ABNORMAL LOW (ref 6–20)
CO2: 22 mmol/L (ref 22–32)
Calcium: 9.2 mg/dL (ref 8.9–10.3)
Chloride: 105 mmol/L (ref 98–111)
Creatinine, Ser: 0.63 mg/dL (ref 0.44–1.00)
GFR, Estimated: >60 mL/min (ref >60)
Glucose, Bld: 102 mg/dL — ABNORMAL HIGH (ref 70–99)
Potassium: 3.6 mmol/L (ref 3.5–5.1)
Sodium: 134 mmol/L — ABNORMAL LOW (ref 135–145)
Total Bilirubin: 0.5 mg/dL (ref 0.3–1.2)
Total Protein: 6.7 g/dL (ref 6.5–8.1)

## 2021-03-17 LAB — HEPATITIS PANEL, ACUTE
HCV Ab: NONREACTIVE
Hep A IgM: NONREACTIVE
Hep B C IgM: NONREACTIVE
Hepatitis B Surface Ag: NONREACTIVE

## 2021-03-17 LAB — URINALYSIS, ROUTINE W REFLEX MICROSCOPIC
Bilirubin Urine: NEGATIVE
Glucose, UA: NEGATIVE mg/dL
Hgb urine dipstick: NEGATIVE
Ketones, ur: NEGATIVE mg/dL
Nitrite: NEGATIVE
Protein, ur: NEGATIVE mg/dL
Specific Gravity, Urine: 1.015 (ref 1.005–1.030)
pH: 6.5 (ref 5.0–8.0)

## 2021-03-17 LAB — CBC
HCT: 34.1 % — ABNORMAL LOW (ref 36.0–46.0)
Hemoglobin: 11.8 g/dL — ABNORMAL LOW (ref 12.0–15.0)
MCH: 32.7 pg (ref 26.0–34.0)
MCHC: 34.6 g/dL (ref 30.0–36.0)
MCV: 94.5 fL (ref 80.0–100.0)
Platelets: 231 10*3/uL (ref 150–400)
RBC: 3.61 MIL/uL — ABNORMAL LOW (ref 3.87–5.11)
RDW: 12.5 % (ref 11.5–15.5)
WBC: 6 10*3/uL (ref 4.0–10.5)
nRBC: 0 % (ref 0.0–0.2)

## 2021-03-17 MED ORDER — PROMETHAZINE HCL 25 MG/ML IJ SOLN
25.0000 mg | Freq: Once | INTRAVENOUS | Status: AC
  Administered 2021-03-17: 25 mg via INTRAVENOUS
  Filled 2021-03-17: qty 1

## 2021-03-17 NOTE — BH Specialist Note (Signed)
Integrated Behavioral Health via Telemedicine Visit  03/17/2021 Gloria Mann [id number]  Number of Integrated Behavioral Health visits: 3 Session Start time: 10:23  Session End time: 10:33 Total time:  10  Referring Provider:  Patient/Family location: Home [facility] Provider location: [facility]  All persons participating in visit: Patient Gloria Mann and [facility] Willford Rabideau   Types of Service: Individual psychotherapy and Telephone visit  I connected with Gloria Mann and/or Gloria Mann  n/a  via  Telephone or Engineer, civil (consulting)  (Video is Surveyor, mining) and verified that I am speaking with the correct person using two identifiers. Discussed confidentiality: Yes   I discussed the limitations of telemedicine and the availability of in person appointments.  Discussed there is a possibility of technology failure and discussed alternative modes of communication if that failure occurs.  I discussed that engaging in this telemedicine visit, they consent to the provision of behavioral healthcare and the services will be billed under their insurance.  Patient and/or legal guardian expressed understanding and consented to Telemedicine visit: Yes   Presenting Concerns: Patient and/or family reports the following symptoms/concerns: Fatigue during daytime; looking forward to job interview next week, and sleep improving at night.  Duration of problem: Current pregnancy; Severity of problem: moderate  Patient and/or Family's Strengths/Protective Factors: Social connections, Social and Emotional competence, Concrete supports in place (healthy food, safe environments, etc.), Sense of purpose, and Physical Health (exercise, healthy diet, medication compliance, etc.)  Goals Addressed: Patient will:  Reduce symptoms of: anxiety, depression, and stress   Demonstrate ability to: Increase healthy adjustment to current  life circumstances  Progress towards Goals: Ongoing  Interventions: Interventions utilized:  Supportive Reflection Standardized Assessments completed:  PHQ9/GAD7 given in past two weeks  Patient and/or Family Response: Pt agrees with treatment plan  Assessment: Patient currently experiencing Psychosocial stress.   Patient may benefit from brief therapeutic intervention today.  Plan: Follow up with behavioral health clinician on : Two months; Call Leanora Murin at [phone], as needed. Behavioral recommendations:  -Continue taking prenatal vitamin daily -Continue prioritizing healthy self-coping strategies (adequate sleep nightly, worry time strategy daily) Referral(s): Integrated [employer] (In Clinic)  I discussed the assessment and treatment plan with the patient and/or parent/guardian. They were provided an opportunity to ask questions and all were answered. They agreed with the plan and demonstrated an understanding of the instructions.   They were advised to call back or seek an in-person evaluation if the symptoms worsen or if the condition fails to improve as anticipated.  Rae Lips, LCSW  Depression screen [facility] 2/9 03/26/2021 03/13/2021 02/12/2021  Decreased Interest 1 1 2   Down, Depressed, Hopeless 0 0 2  PHQ - 2 Score 1 1 4   Altered sleeping 2 2 2   Tired, decreased energy 1 2 2   Change in appetite 0 0 1  Feeling bad or failure about yourself  0 0 2  Trouble concentrating 0 0 0  Moving slowly or fidgety/restless 0 0 0  Suicidal thoughts 0 0 0  PHQ-9 Score 4 5 11   Difficult doing work/chores Not difficult at all - -   GAD 7 : Generalized Anxiety Score 03/26/2021 03/13/2021 02/12/2021  Nervous, Anxious, on Edge 0 1 3  Control/stop worrying 0 1 3  Worry too much - different things 0 1 3  Trouble relaxing 1 1 0  Restless 0 0 0  Easily annoyed or irritable 0 0 0  Afraid - awful  might happen 0 0 3  Total GAD 7 Score 1 4 12   Anxiety Difficulty Not  difficult at all - -

## 2021-03-17 NOTE — MAU Provider Note (Signed)
History     CSN: [id number]  Arrival date and time: 03/17/21 3832   Event Date/Time   First Provider Initiated Contact with Patient 03/17/21 1024      Chief Complaint  Patient presents with   Dizziness   Nausea   Emesis   Headache   Back Pain   HPI Gloria Mann is 30 y.o. [id number] at 54w0dwho presents to MAU with chief complaint of migraine. Onset of complaint: three days ago. Pain score is 8/10. Pain is anterior, dull and constant. She prefers to avoid medication and so has not taken medication or tried other treatments for this complaint. She denies aggravating or alleviating factors. She endorses having 3-4 migraines per calendar year. Each migraine typically lasts 4 days.  Patient also c/o generalized lower abdominal pain and low back pain. Abdominal pain is crampy. Back pain is sore, pain score 9/10. She denies aggravating or alleviating factors.   Patient also c/o mild intermittent vomiting. She states this has improved in the past month. She states she "used to smoke" but discontinued use once learning she was pregnancy.  [facility]   OB History     Gravida  5   Para  2   Term  0   Preterm  2   AB  2   Living  2      SAB  1   IAB  1   Ectopic  0   Multiple      Live Births  2        Obstetric Comments  Daughter was born at 331 weekspt had ICP Son was born at 369 weekspt had ICP  [facility]         Past Medical History:  Diagnosis Date   Headache    Herpes genitalis    Intrahepatic cholestasis of pregnancy     Past Surgical History:  Procedure Laterality Date   NO PAST SURGERIES      Family History  Problem Relation Age of Onset   Depression Mother    Cancer Mother        breast   High Cholesterol Mother    Thyroid disease Mother    Hypertension Father    Diabetes Paternal Aunt    Colon cancer Paternal Uncle     Social History   Tobacco Use   Smoking  status: Never   Smokeless tobacco: Never  Vaping Use   Vaping Use: Never used  Substance Use Topics   Alcohol use: Not Currently   Drug use: Not Currently    Types: Marijuana    Comment: last time smoked 12/25/2020    Allergies:  Allergies  Allergen Reactions   Morphine And Related     Medications Prior to Admission  Medication Sig Dispense Refill Last Dose   Prenatal Vit-Fe Fumarate-FA (PREPLUS) 27-1 MG TABS Take 1 tablet by mouth daily. 30 tablet 12 Past Week   Blood Pressure Monitoring (BLOOD PRESSURE KIT) DEVI Z34.90 Use for Bllod pressure Monitoring as needed 1 each 0    metoCLOPramide (REGLAN) 10 MG tablet Take 1 tablet (10 mg total) by mouth every 6 (six) hours as needed for nausea. (Patient not taking: Reported on 02/23/2021) 30 tablet 1    promethazine (PHENERGAN) 25 MG tablet Take 1 tablet (25 mg total) by mouth every 6 (six) hours as needed for nausea or vomiting. (Patient not taking: Reported on 02/23/2021) 30 tablet 0  scopolamine (TRANSDERM-SCOP) 1 MG/3DAYS Place 1 patch (1.5 mg total) onto the skin every 3 (three) days. (Patient not taking: Reported on 02/23/2021) 10 patch 1     Review of Systems  Constitutional:  Positive for fatigue. Negative for appetite change, chills and fever.  Eyes:  Positive for photophobia. Negative for visual disturbance.  Gastrointestinal:  Positive for abdominal pain, nausea and vomiting.  Genitourinary:  Negative for vaginal bleeding, vaginal discharge and vaginal pain.  Musculoskeletal:  Positive for back pain.  Neurological:  Positive for dizziness and headaches.  All other systems reviewed and are negative. Physical Exam   Blood pressure (!) 89/56, pulse 83, temperature 98.3 F (36.8 C), temperature source Oral, resp. rate 17, last menstrual period 12/16/2020, SpO2 98 %.  Physical Exam Vitals and nursing note reviewed.  Constitutional:      Appearance: She is well-developed. She is not ill-appearing.  Cardiovascular:      Rate and Rhythm: Normal rate and regular rhythm.     Heart sounds: Normal heart sounds.  Pulmonary:     Effort: Pulmonary effort is normal.     Breath sounds: Normal breath sounds.  Abdominal:     Palpations: Abdomen is soft.  Skin:    General: Skin is warm and dry.     Capillary Refill: Capillary refill takes less than 2 seconds.  Neurological:     Mental Status: She is alert and oriented to person, place, and time.  Psychiatric:        Mood and Affect: Mood normal.        Speech: Speech normal.        Behavior: Behavior normal.   MAU Course/MDM  Procedures  --Patient sleeping 45 min after Phenergan bolus --Pertinent negatives: vomiting in MAU, elevated WBSc --Hx ICP x 2 previous pregnancies. Elevated liver enzymes today, normal liver labs at [facility]. Discussed with Dr. Kennon Rounds. Will add on Hepatitis Panel, follow in conjunction with prenatal care.  --Abnormal UA with large component of squam epi indicating contaminated  catch. No urinary complaints. No CVAT, hematuria or fever. Will follow in office  Orders Placed This Encounter  Procedures   Urinalysis, Routine w reflex microscopic Urine, Clean Catch   CBC   Comprehensive metabolic panel   Urinalysis, Microscopic (reflex)   Hepatitis panel, acute   Insert peripheral IV   Discharge patient   Patient Vitals for the past 24 hrs:  BP Temp Temp src Pulse Resp SpO2  03/17/21 1248 (!) 89/56 -- -- 83 -- --  03/17/21 1236 (!) 95/48 -- -- 65 -- --  03/17/21 1023 119/74 -- -- (!) 106 -- --  03/17/21 1020 (!) 99/55 -- -- 99 -- 98 %  03/17/21 1019 (!) 106/59 -- -- 87 -- --  03/17/21 1018 (!) 102/59 -- -- 74 -- --  03/17/21 1008 125/61 98.3 F (36.8 C) Oral 92 17 100 %   Results for orders placed or performed during the hospital encounter of 03/17/21 (from the past 24 hour(s))  Urinalysis, Routine w reflex microscopic Urine, Clean Catch     Status: Abnormal   Collection Time: 03/17/21 10:22 AM  Result Value Ref Range   Color,  Urine YELLOW YELLOW   APPearance HAZY (A) CLEAR   Specific Gravity, Urine 1.015 1.005 - 1.030   pH 6.5 5.0 - 8.0   Glucose, UA NEGATIVE NEGATIVE mg/dL   Hgb urine dipstick NEGATIVE NEGATIVE   Bilirubin Urine NEGATIVE NEGATIVE   Ketones, ur NEGATIVE NEGATIVE mg/dL   Protein, ur  NEGATIVE NEGATIVE mg/dL   Nitrite NEGATIVE NEGATIVE   Leukocytes,Ua SMALL (A) NEGATIVE  Urinalysis, Microscopic (reflex)     Status: Abnormal   Collection Time: 03/17/21 10:22 AM  Result Value Ref Range   RBC / HPF 0-5 0 - 5 RBC/hpf   WBC, UA 0-5 0 - 5 WBC/hpf   Bacteria, UA RARE (A) NONE SEEN   Squamous Epithelial / LPF 21-50 0 - 5   Non Squamous Epithelial PRESENT (A) NONE SEEN   Mucus PRESENT    Amorphous Crystal PRESENT   CBC     Status: Abnormal   Collection Time: 03/17/21 10:36 AM  Result Value Ref Range   WBC 6.0 4.0 - 10.5 K/uL   RBC 3.61 (L) 3.87 - 5.11 MIL/uL   Hemoglobin 11.8 (L) 12.0 - 15.0 g/dL   HCT 34.1 (L) 36.0 - 46.0 %   MCV 94.5 80.0 - 100.0 fL   MCH 32.7 26.0 - 34.0 pg   MCHC 34.6 30.0 - 36.0 g/dL   RDW 12.5 11.5 - 15.5 %   Platelets 231 150 - 400 K/uL   nRBC 0.0 0.0 - 0.2 %  Comprehensive metabolic panel     Status: Abnormal   Collection Time: 03/17/21 10:36 AM  Result Value Ref Range   Sodium 134 (L) 135 - 145 mmol/L   Potassium 3.6 3.5 - 5.1 mmol/L   Chloride 105 98 - 111 mmol/L   CO2 22 22 - 32 mmol/L   Glucose, Bld 102 (H) 70 - 99 mg/dL   BUN 5 (L) 6 - 20 mg/dL   Creatinine, Ser 0.63 0.44 - 1.00 mg/dL   Calcium 9.2 8.9 - 10.3 mg/dL   Total Protein 6.7 6.5 - 8.1 g/dL   Albumin 3.6 3.5 - 5.0 g/dL   AST 56 (H) 15 - 41 U/L   ALT 116 (H) 0 - 44 U/L   Alkaline Phosphatase 65 38 - 126 U/L   Total Bilirubin 0.5 0.3 - 1.2 mg/dL   GFR, Estimated >60 >60 mL/min   Anion gap 7 5 - 15  Hepatitis panel, acute     Status: None   Collection Time: 03/17/21 12:44 PM  Result Value Ref Range   Hepatitis B Surface Ag NON REACTIVE NON REACTIVE   HCV Ab NON REACTIVE NON REACTIVE   Hep A  IgM NON REACTIVE NON REACTIVE   Hep B C IgM NON REACTIVE NON REACTIVE   Meds ordered this encounter  Medications   promethazine (PHENERGAN) 25 mg in lactated ringers 1,000 mL infusion   Assessment and Plan  --30 y.o. [id number] at [redacted]w[redacted]d --FHT 168 by Doppler --Pain score 1/10 prior to discharge --Elevated liver enzymes, normal Hepatitis panel --Discharge home in stable condition  F/U --Next appt at [facility] is 03/24/2021  SDarlina Rumpf [place] MSN, CNM 03/17/2021, 4:29 PM

## 2021-03-17 NOTE — MAU Note (Signed)
.  Gloria Mann is a 30 y.o. at [redacted]w[redacted]d here in MAU reporting: has been throwing up for the past x3 days with streaks of blood in it. No diarrhea. Reports migraine for x3 days as well. Has not taken anything for the pain. Also having lower back pain that is worse when she stands. Denies vb, has had an increase in thick white discharge this week.    Vitals:   03/17/21 1008  BP: 125/61  Pulse: 92  Resp: 17  Temp: 98.3 F (36.8 C)  SpO2: 100%     FHT:168 Lab orders placed from triage:  UA

## 2021-03-19 ENCOUNTER — Inpatient Hospital Stay (HOSPITAL_COMMUNITY)
Admission: AD | Admit: 2021-03-19 | Discharge: 2021-03-19 | Disposition: A | Discharge status: Home / Self Care | Payer: Medicaid Other | Attending: Family Medicine | Admitting: Family Medicine

## 2021-03-19 ENCOUNTER — Other Ambulatory Visit: Payer: Self-pay

## 2021-03-19 ENCOUNTER — Inpatient Hospital Stay (HOSPITAL_COMMUNITY): Payer: Medicaid Other

## 2021-03-19 ENCOUNTER — Encounter (HOSPITAL_COMMUNITY): Payer: Self-pay | Admitting: Family Medicine

## 2021-03-19 DIAGNOSIS — G43909 Migraine, unspecified, not intractable, without status migrainosus: Secondary | ICD-10-CM

## 2021-03-19 DIAGNOSIS — O99351 Diseases of the nervous system complicating pregnancy, first trimester: Secondary | ICD-10-CM | POA: Insufficient documentation

## 2021-03-19 DIAGNOSIS — Z3A13 13 weeks gestation of pregnancy: Secondary | ICD-10-CM

## 2021-03-19 LAB — COMPREHENSIVE METABOLIC PANEL
ALT: 106 U/L — ABNORMAL HIGH (ref 0–44)
AST: 48 U/L — ABNORMAL HIGH (ref 15–41)
Albumin: 3.6 g/dL (ref 3.5–5.0)
Alkaline Phosphatase: 60 U/L (ref 38–126)
Anion gap: 7 (ref 5–15)
BUN: 6 mg/dL (ref 6–20)
CO2: 24 mmol/L (ref 22–32)
Calcium: 9.5 mg/dL (ref 8.9–10.3)
Chloride: 101 mmol/L (ref 98–111)
Creatinine, Ser: 0.66 mg/dL (ref 0.44–1.00)
GFR, Estimated: >60 mL/min (ref >60)
Glucose, Bld: 120 mg/dL — ABNORMAL HIGH (ref 70–99)
Potassium: 4 mmol/L (ref 3.5–5.1)
Sodium: 132 mmol/L — ABNORMAL LOW (ref 135–145)
Total Bilirubin: 0.5 mg/dL (ref 0.3–1.2)
Total Protein: 6.6 g/dL (ref 6.5–8.1)

## 2021-03-19 LAB — CBC WITH DIFFERENTIAL/PLATELET
Abs Immature Granulocytes: 0.02 10*3/uL (ref 0.00–0.07)
Basophils Absolute: 0 10*3/uL (ref 0.0–0.1)
Basophils Relative: 0 %
Eosinophils Absolute: 0.1 10*3/uL (ref 0.0–0.5)
Eosinophils Relative: 2 %
HCT: 34.8 % — ABNORMAL LOW (ref 36.0–46.0)
Hemoglobin: 12.3 g/dL (ref 12.0–15.0)
Immature Granulocytes: 0 %
Lymphocytes Relative: 20 %
Lymphs Abs: 1.4 10*3/uL (ref 0.7–4.0)
MCH: 33.8 pg (ref 26.0–34.0)
MCHC: 35.3 g/dL (ref 30.0–36.0)
MCV: 95.6 fL (ref 80.0–100.0)
Monocytes Absolute: 0.6 10*3/uL (ref 0.1–1.0)
Monocytes Relative: 8 %
Neutro Abs: 4.8 10*3/uL (ref 1.7–7.7)
Neutrophils Relative %: 70 %
Platelets: 244 10*3/uL (ref 150–400)
RBC: 3.64 MIL/uL — ABNORMAL LOW (ref 3.87–5.11)
RDW: 12.7 % (ref 11.5–15.5)
WBC: 7 10*3/uL (ref 4.0–10.5)
nRBC: 0 % (ref 0.0–0.2)

## 2021-03-19 MED ORDER — ACETAMINOPHEN-CAFFEINE 500-65 MG PO TABS: 2.0000 | ORAL_TABLET | Freq: Three times a day (TID) | ORAL | 0 refills | Status: DC | PRN

## 2021-03-19 MED ORDER — KETOROLAC TROMETHAMINE 15 MG/ML IJ SOLN
15.0000 mg | Freq: Once | INTRAMUSCULAR | Status: AC
  Administered 2021-03-19: 15 mg via INTRAVENOUS
  Filled 2021-03-19: qty 1

## 2021-03-19 MED ORDER — METOCLOPRAMIDE HCL 5 MG/ML IJ SOLN
10.0000 mg | Freq: Once | INTRAMUSCULAR | Status: AC
  Administered 2021-03-19: 10 mg via INTRAVENOUS
  Filled 2021-03-19: qty 2

## 2021-03-19 MED ORDER — DIPHENHYDRAMINE HCL 50 MG/ML IJ SOLN
25.0000 mg | Freq: Once | INTRAMUSCULAR | Status: AC
  Administered 2021-03-19: 25 mg via INTRAVENOUS
  Filled 2021-03-19: qty 1

## 2021-03-19 MED ORDER — CYCLOBENZAPRINE HCL 10 MG PO TABS: 10.0000 mg | ORAL_TABLET | Freq: Three times a day (TID) | ORAL | 0 refills | Status: DC | PRN

## 2021-03-19 MED ORDER — METOCLOPRAMIDE HCL 10 MG PO TABS: 10.0000 mg | ORAL_TABLET | Freq: Once | ORAL | Status: DC

## 2021-03-19 MED ORDER — LACTATED RINGERS IV BOLUS
1000.0000 mL | Freq: Once | INTRAVENOUS | Status: AC
  Administered 2021-03-19: 1000 mL via INTRAVENOUS

## 2021-03-19 NOTE — MAU Note (Signed)
Gloria Mann is a 30 y.o. at [redacted]w[redacted]d here in MAU reporting: ongoing migraine since previous visit. States the pain is making her dizzy and nauseated. Also having left lower abdominal pain. No bleeding or discharge.  Onset of complaint: ongoing  Pain score: head 10/10, abdominal 6/10  Vitals:   03/19/21 1052  BP: (!) 108/51  Pulse: 93  Resp: 16  Temp: 98.2 F (36.8 C)  SpO2: 98%     Lab orders placed from triage: UA

## 2021-03-19 NOTE — MAU Provider Note (Signed)
History     CSN: [id number]  Arrival date and time: 03/19/21 1025   Event Date/Time   First Provider Initiated Contact with Patient 03/19/21 1104      Chief Complaint  Patient presents with   Abdominal Pain   Headache   Nausea   Dizziness   Gloria Mann is a 30 y.o. [id number] at 10w2dwho presents today with a headache. She was seen for this on 03/17/2021. She states that by the time she got home her headache had returned. She reports that prior to pregnancy she would take Excedrin or sudafed for headaches and they would resolve. She reports that she is also able to sleep off a headache most of time. However, this headache is not getting better and she cannot even sleep because moving her head in certain positions increases the pain.  She reports that she saw a neurologist when she was younger, but never really had a migraine treatment plan.   Headache  This is a new problem. Episode onset: 03/14/2021. The problem occurs constantly. The problem has been unchanged. The pain is located in the Bilateral region. The pain does not radiate. The pain quality is similar to prior headaches. The quality of the pain is described as throbbing. The pain is at a severity of 10/10. The pain is severe. Associated symptoms include dizziness, nausea, photophobia and vomiting. Pertinent negatives include no fever. The symptoms are aggravated by bright light. She has tried acetaminophen and darkened room for the symptoms. The treatment provided no relief (will get brief relief, but then headache returns.).   OB History     Gravida  5   Para  2   Term  0   Preterm  2   AB  2   Living  2      SAB  1   IAB  1   Ectopic  0   Multiple      Live Births  2        Obstetric Comments  Daughter was born at 329 weekspt had ICP Son was born at 369 weekspt had ICP  [facility]         Past Medical History:  Diagnosis Date   Headache    Herpes genitalis     Intrahepatic cholestasis of pregnancy     Past Surgical History:  Procedure Laterality Date   NO PAST SURGERIES      Family History  Problem Relation Age of Onset   Depression Mother    Cancer Mother        breast   High Cholesterol Mother    Thyroid disease Mother    Hypertension Father    Diabetes Paternal Aunt    Colon cancer Paternal Uncle     Social History   Tobacco Use   Smoking status: Never   Smokeless tobacco: Never  Vaping Use   Vaping Use: Never used  Substance Use Topics   Alcohol use: Not Currently   Drug use: Not Currently    Types: Marijuana    Comment: last time smoked 12/25/2020    Allergies:  Allergies  Allergen Reactions   Morphine And Related     Medications Prior to Admission  Medication Sig Dispense Refill Last Dose   Blood Pressure Monitoring (BLOOD PRESSURE KIT) DEVI Z34.90 Use for Bllod pressure Monitoring as needed 1 each 0    metoCLOPramide (REGLAN) 10 MG tablet Take 1 tablet (10 mg total) by  mouth every 6 (six) hours as needed for nausea. (Patient not taking: Reported on 02/23/2021) 30 tablet 1    Prenatal Vit-Fe Fumarate-FA (PREPLUS) 27-1 MG TABS Take 1 tablet by mouth daily. 30 tablet 12    promethazine (PHENERGAN) 25 MG tablet Take 1 tablet (25 mg total) by mouth every 6 (six) hours as needed for nausea or vomiting. (Patient not taking: Reported on 02/23/2021) 30 tablet 0    scopolamine (TRANSDERM-SCOP) 1 MG/3DAYS Place 1 patch (1.5 mg total) onto the skin every 3 (three) days. (Patient not taking: Reported on 02/23/2021) 10 patch 1     Review of Systems  Constitutional:  Negative for chills and fever.  Eyes:  Positive for photophobia.  Respiratory: Negative.    Cardiovascular: Negative.   Gastrointestinal:  Positive for nausea and vomiting.  Endocrine: Negative.   Genitourinary: Negative.   Musculoskeletal: Negative.   Skin: Negative.   Allergic/Immunologic: Negative.   Neurological:  Positive for dizziness and headaches.   Hematological: Negative.   Psychiatric/Behavioral: Negative.    Physical Exam   Blood pressure (!) 108/51, pulse 93, temperature 98.2 F (36.8 C), temperature source Oral, resp. rate 16, height _0  (1.6 m), weight 60.2 kg, last menstrual period 12/16/2020, SpO2 98 %.  Physical Exam Vitals and nursing note reviewed.  HENT:     Head: Normocephalic.  Cardiovascular:     Rate and Rhythm: Normal rate.  Pulmonary:     Effort: Pulmonary effort is normal.  Abdominal:     Palpations: Abdomen is soft.     Tenderness: There is no abdominal tenderness.  Skin:    General: Skin is warm and dry.  Neurological:     Mental Status: She is alert and oriented to person, place, and time.  Psychiatric:        Mood and Affect: Mood normal.        Behavior: Behavior normal.   FHT 160 with doppler   Results for orders placed or performed during the hospital encounter of 03/19/21 (from the past 24 hour(s))  CBC with Differential/Platelet     Status: Abnormal   Collection Time: 03/19/21 11:31 AM  Result Value Ref Range   WBC 7.0 4.0 - 10.5 K/uL   RBC 3.64 (L) 3.87 - 5.11 MIL/uL   Hemoglobin 12.3 12.0 - 15.0 g/dL   HCT 34.8 (L) 36.0 - 46.0 %   MCV 95.6 80.0 - 100.0 fL   MCH 33.8 26.0 - 34.0 pg   MCHC 35.3 30.0 - 36.0 g/dL   RDW 12.7 11.5 - 15.5 %   Platelets 244 150 - 400 K/uL   nRBC 0.0 0.0 - 0.2 %   Neutrophils Relative % 70 %   Neutro Abs 4.8 1.7 - 7.7 K/uL   Lymphocytes Relative 20 %   Lymphs Abs 1.4 0.7 - 4.0 K/uL   Monocytes Relative 8 %   Monocytes Absolute 0.6 0.1 - 1.0 K/uL   Eosinophils Relative 2 %   Eosinophils Absolute 0.1 0.0 - 0.5 K/uL   Basophils Relative 0 %   Basophils Absolute 0.0 0.0 - 0.1 K/uL   Immature Granulocytes 0 %   Abs Immature Granulocytes 0.02 0.00 - 0.07 K/uL  Comprehensive metabolic panel     Status: Abnormal   Collection Time: 03/19/21 11:31 AM  Result Value Ref Range   Sodium 132 (L) 135 - 145 mmol/L   Potassium 4.0 3.5 - 5.1 mmol/L   Chloride 101  98 - 111 mmol/L   CO2 24 22 -  32 mmol/L   Glucose, Bld 120 (H) 70 - 99 mg/dL   BUN 6 6 - 20 mg/dL   Creatinine, Ser 0.66 0.44 - 1.00 mg/dL   Calcium 9.5 8.9 - 10.3 mg/dL   Total Protein 6.6 6.5 - 8.1 g/dL   Albumin 3.6 3.5 - 5.0 g/dL   AST 48 (H) 15 - 41 U/L   ALT 106 (H) 0 - 44 U/L   Alkaline Phosphatase 60 38 - 126 U/L   Total Bilirubin 0.5 0.3 - 1.2 mg/dL   GFR, Estimated >60 >60 mL/min   Anion gap 7 5 - 15   CT HEAD WO CONTRAST (5MM)  Result Date: 03/19/2021 CLINICAL DATA:  Headache. EXAM: CT HEAD WITHOUT CONTRAST TECHNIQUE: Contiguous axial images were obtained from the base of the skull through the vertex without intravenous contrast. COMPARISON:  None. FINDINGS: Brain: No evidence of acute infarction, hemorrhage, hydrocephalus, extra-axial collection or mass lesion/mass effect. Vascular: No hyperdense vessel or unexpected calcification. Skull: Normal. Negative for fracture or focal lesion. Sinuses/Orbits: No acute finding. Other: None. IMPRESSION: No acute intracranial abnormality seen. Electronically Signed   By: Marijo Conception M.D.   On: 03/19/2021 12:01     MAU Course  Procedures  MDM Patient has had LR bolus, toradol, benadryl and reglan. She reports that she is feeling better. Pain is now 0/10 at this time.   Assessment and Plan   1. Migraine without status migrainosus, not intractable, unspecified migraine type   2. [redacted] weeks gestation of pregnancy    DC home Comfort measures reviewed  2nd Trimester precautions  RX: tylenol/caffeine tabs PRN, Flexeril PRN. Patient has reglan rx already. Encouraged patient to use these medications as needed Return to MAU as needed FU with OB as planned OB office can consider neuro referral if unable to treat with meds provided    Follow-up Information     [facility] Follow up.   Specialty: Obstetrics and Gynecology Contact information: [place]  [id number] [place] DNP, CNM  03/19/21  1:12 PM

## 2021-03-26 ENCOUNTER — Other Ambulatory Visit (HOSPITAL_COMMUNITY)
Admission: RE | Admit: 2021-03-26 | Discharge: 2021-03-26 | Disposition: A | Discharge status: Home / Self Care | Payer: Medicaid Other | Source: Ambulatory Visit | Attending: Obstetrics and Gynecology | Admitting: Obstetrics and Gynecology

## 2021-03-26 ENCOUNTER — Ambulatory Visit (INDEPENDENT_AMBULATORY_CARE_PROVIDER_SITE_OTHER): Payer: Medicaid Other | Admitting: Obstetrics and Gynecology

## 2021-03-26 ENCOUNTER — Other Ambulatory Visit: Payer: Self-pay

## 2021-03-26 ENCOUNTER — Encounter: Payer: Self-pay | Admitting: Obstetrics and Gynecology

## 2021-03-26 VITALS — BP 99/62 | HR 82 | Temp 97.6°F | Wt 136.6 lb

## 2021-03-26 DIAGNOSIS — O26899 Other specified pregnancy related conditions, unspecified trimester: Secondary | ICD-10-CM | POA: Insufficient documentation

## 2021-03-26 DIAGNOSIS — N898 Other specified noninflammatory disorders of vagina: Secondary | ICD-10-CM | POA: Insufficient documentation

## 2021-03-26 DIAGNOSIS — G43909 Migraine, unspecified, not intractable, without status migrainosus: Secondary | ICD-10-CM

## 2021-03-26 DIAGNOSIS — O0992 Supervision of high risk pregnancy, unspecified, second trimester: Secondary | ICD-10-CM

## 2021-03-26 DIAGNOSIS — Z8759 Personal history of other complications of pregnancy, childbirth and the puerperium: Secondary | ICD-10-CM | POA: Diagnosis not present

## 2021-03-26 DIAGNOSIS — Z3A14 14 weeks gestation of pregnancy: Secondary | ICD-10-CM | POA: Diagnosis not present

## 2021-03-26 DIAGNOSIS — Z3480 Encounter for supervision of other normal pregnancy, unspecified trimester: Secondary | ICD-10-CM | POA: Insufficient documentation

## 2021-03-26 DIAGNOSIS — Z8719 Personal history of other diseases of the digestive system: Secondary | ICD-10-CM

## 2021-03-26 MED ORDER — URSODIOL 500 MG PO TABS: 500.0000 mg | ORAL_TABLET | Freq: Two times a day (BID) | ORAL | 3 refills | Status: DC

## 2021-03-26 NOTE — Progress Notes (Signed)
INITIAL OBSTETRICAL VISIT Patient name: Gloria Mann MRN [id number]  Date of birth: Sep 19, 1990 Chief Complaint:   Initial Prenatal Visit  History of Present Illness:   Gloria Oros is a 30 y.o. [phone] African American female at [redacted]w[redacted]d by LMP with an Estimated Date of Delivery: 09/22/21 being seen today for her initial obstetrical visit.  Her obstetrical history is significant for  h/o ICP with last 2 pregnancies, PTB (1st at 33 wks in 2014 and 2nd @ 35 wks in 2017). She reports she has "already started itching like crazy." She reports the itching started at 14 wks in her last 2 pregnancies . This is a planned pregnancy. She and the father of the baby (FOB)/spouse "Alvester Chou"  live together. She has a support system that consists of her spouse/family/friends. Today she reports  milder headache and "itching like crazy" .   Patient's last menstrual period was 12/16/2020 (exact date). Last pap unknown pt reports last pap was in 04/2020, but no records were found at facility she reports having pap in. Results were:  unknown Review of Systems:   Pertinent items are noted in HPI Denies cramping/contractions, leakage of fluid, vaginal bleeding, abnormal vaginal discharge w/ itching/odor/irritation, headaches, visual changes, shortness of breath, chest pain, abdominal pain, severe nausea/vomiting, or problems with urination or bowel movements unless otherwise stated above.  Pertinent History Reviewed:  Reviewed past medical,surgical, social, obstetrical and family history.  Reviewed problem list, medications and allergies. OB History  Gravida Para Term Preterm AB Living  5 2 0 2 2 2   SAB IAB Ectopic Multiple Live Births  1 1 0   2    # Outcome Date GA Lbr Len/2nd Weight Sex Delivery Anes PTL Lv  5 Current           4 Preterm 08/30/15 [redacted]w[redacted]d   M Vag-Spont   LIV  3 IAB 2015          2 Preterm 01/13/13 [redacted]w[redacted]d   F Vag-Spont   LIV  1 SAB 2013            Obstetric Comments  Daughter was born at 8 weeks pt  had ICP  Son was born at 31 weeks pt had ICP   Both born at [facility] in [place]    Physical Assessment:   Vitals:   03/26/21 0826  BP: 99/62  Pulse: 82  Temp: 97.6 F (36.4 C)  Weight: 136 lb 9.6 oz (62 kg)  Body mass index is 24.2 kg/m.       Physical Examination:  General appearance - well appearing, and in no distress  Mental status - alert, oriented to person, place, and time  Psych:  She has a normal mood and affect  Skin - warm and dry, normal color, no suspicious lesions noted  Chest - effort normal, all lung fields clear to auscultation bilaterally  Heart - normal rate and regular rhythm  Abdomen - soft, nontender  Extremities:  No swelling or varicosities noted  Pelvic - VULVA: normal appearing vulva with no masses, tenderness or lesions  VAGINA: normal appearing vagina with normal color and discharge, no lesions.   CERVIX: normal appearing cervix without discharge or lesions, no CMT  Thin prep pap is done with reflex HR HPV cotesting   FHTs by doppler: 161 bpm  Assessment & Plan:  1) HIgh-Risk Pregnancy [id number] at [redacted]w[redacted]d with an Estimated Date of Delivery: 09/22/21   2) Initial OB visit - Welcomed to practice and introduced self to patient  in addition to discussing other advanced practice providers that she may be seeing at this practice - Congratulated patient - Anticipatory guidance on upcoming appointments - Educated on COVID19 and pregnancy and the integration of virtual appointments  - Educated on babyscripts app- patient reports she has not received email, encouraged to look in spam folder and to call office if she still has not received email - patient verbalizes understanding    3) Supervision of high risk pregnancy in second trimester - Obstetric Panel, Including HIV - Hepatitis C antibody - Cervicovaginal ancillary only( [place]) - Cytology - PAP( [place]) - Genetic Screening  4) History of cholestasis during pregnancy - Bile acids,  total - CMP done on 03/19/21 >> AST = 48, ALT = 106 - Rx for ursodiol (ACTIGALL) 500 MG tablet; Take 1 tablet (500 mg total) by mouth 2 (two) times daily.  Dispense: 60 tablet; Refill: 3 - Consult MFM when lab results return  5) [redacted] weeks gestation of pregnancy - Obstetric Panel, Including HIV - Hepatitis C antibody - Cervicovaginal ancillary only( [place]) - Genetic Screening  6) Vaginal discharge during pregnancy, antepartum - Cervicovaginal ancillary only( [place])  7) Migraine without status migrainosus, not intractable, unspecified migraine type - Ambulatory referral to Neurology    Meds:  Meds ordered this encounter  Medications   ursodiol (ACTIGALL) 500 MG tablet    Sig: Take 1 tablet (500 mg total) by mouth 2 (two) times daily.    Dispense:  60 tablet    Refill:  3    Can substitute 300 mg caps po tid (quantity 90 with three refills) if 500 mg tabs are not available    Order Specific Question:   Supervising Provider    Answer:   Reva Bores [[id number]]    Initial labs obtained Continue prenatal vitamins Reviewed n/v relief measures and warning s/s to report Reviewed recommended weight gain based on pre-gravid BMI Encouraged well-balanced diet Genetic Screening discussed: ordered Cystic fibrosis, SMA, Fragile X screening discussed ordered The nature of [facility] with multiple MDs and other Advanced Practice Providers was explained to patient; also emphasized that residents, students are part of our team.  Discussed optimized OB schedule and video visits. Advised can have an in-office visit whenever she feels she needs to be seen. Discussed that her care would be co-managed with MFM. If is is determined that her pregnancy is too high-risk for this office, we will transfer her to one of our high risk OB offices.  Does have own BP cuff. Check BP weekly, let us know if >140/90. Advised to call during normal business hours and there  is an after-hours nurse line available.    Follow-up: Return in about 4 weeks (around 04/23/2021) for Return OB visit - AFP.   Orders Placed This Encounter  Procedures   Bile acids, total   Obstetric Panel, Including HIV   Hepatitis C antibody   Genetic Screening   Ambulatory referral to Neurology    Raelyn Mora MSN, CNM 03/26/2021

## 2021-03-27 ENCOUNTER — Ambulatory Visit: Payer: Medicaid Other | Admitting: Clinical

## 2021-03-27 DIAGNOSIS — Z658 Other specified problems related to psychosocial circumstances: Secondary | ICD-10-CM

## 2021-03-27 LAB — OBSTETRIC PANEL, INCLUDING HIV
Antibody Screen: NEGATIVE
Basophils Absolute: 0 10*3/uL (ref 0.0–0.2)
Basos: 0 %
EOS (ABSOLUTE): 0.1 10*3/uL (ref 0.0–0.4)
Eos: 2 %
HIV Screen 4th Generation wRfx: NONREACTIVE
Hematocrit: 34.1 % (ref 34.0–46.6)
Hemoglobin: 11.7 g/dL (ref 11.1–15.9)
Hepatitis B Surface Ag: NEGATIVE
Immature Grans (Abs): 0 10*3/uL (ref 0.0–0.1)
Immature Granulocytes: 0 %
Lymphocytes Absolute: 1.5 10*3/uL (ref 0.7–3.1)
Lymphs: 23 %
MCH: 32.8 pg (ref 26.6–33.0)
MCHC: 34.3 g/dL (ref 31.5–35.7)
MCV: 96 fL (ref 79–97)
Monocytes Absolute: 0.6 10*3/uL (ref 0.1–0.9)
Monocytes: 9 %
Neutrophils Absolute: 4.3 10*3/uL (ref 1.4–7.0)
Neutrophils: 66 %
Platelets: 243 10*3/uL (ref 150–450)
RBC: 3.57 x10E6/uL — ABNORMAL LOW (ref 3.77–5.28)
RDW: 12.8 % (ref 11.7–15.4)
RPR Ser Ql: NONREACTIVE
Rh Factor: POSITIVE
Rubella Antibodies, IGG: 3.86 index (ref >0.99)
WBC: 6.6 10*3/uL (ref 3.4–10.8)

## 2021-03-27 LAB — HEPATITIS C ANTIBODY: Hep C Virus Ab: 0.1 s/co ratio (ref 0.0–0.9)

## 2021-03-27 NOTE — Patient Instructions (Signed)
[facility] [address] [place], [place] [address] [phone] (main office) [phone] (Mashell Sieben's office)   

## 2021-03-28 LAB — BILE ACIDS, TOTAL: Bile Acids Total: 5.9 umol/L (ref 0.0–10.0)

## 2021-03-30 LAB — CERVICOVAGINAL ANCILLARY ONLY
Bacterial Vaginitis (gardnerella): POSITIVE — AB
Candida Glabrata: NEGATIVE
Candida Vaginitis: NEGATIVE
Chlamydia: NEGATIVE
Comment: NEGATIVE
Comment: NEGATIVE
Comment: NEGATIVE
Comment: NEGATIVE
Comment: NEGATIVE
Comment: NORMAL
Neisseria Gonorrhea: NEGATIVE
Trichomonas: NEGATIVE

## 2021-03-31 LAB — CYTOLOGY - PAP: Diagnosis: NEGATIVE

## 2021-04-07 ENCOUNTER — Other Ambulatory Visit: Payer: Self-pay | Admitting: *Deleted

## 2021-04-07 DIAGNOSIS — O0992 Supervision of high risk pregnancy, unspecified, second trimester: Secondary | ICD-10-CM

## 2021-04-07 DIAGNOSIS — B9689 Other specified bacterial agents as the cause of diseases classified elsewhere: Secondary | ICD-10-CM

## 2021-04-07 DIAGNOSIS — O09899 Supervision of other high risk pregnancies, unspecified trimester: Secondary | ICD-10-CM

## 2021-04-07 DIAGNOSIS — Z3480 Encounter for supervision of other normal pregnancy, unspecified trimester: Secondary | ICD-10-CM

## 2021-04-07 DIAGNOSIS — Z658 Other specified problems related to psychosocial circumstances: Secondary | ICD-10-CM

## 2021-04-07 DIAGNOSIS — Z8759 Personal history of other complications of pregnancy, childbirth and the puerperium: Secondary | ICD-10-CM

## 2021-04-07 MED ORDER — METRONIDAZOLE 500 MG PO TABS
500.0000 mg | ORAL_TABLET | Freq: Two times a day (BID) | ORAL | 0 refills | Status: DC
Start: 2021-04-07 — End: 2021-04-22

## 2021-04-07 NOTE — Progress Notes (Signed)
Ultrasound needed to change from complete to detail due to history of cholestasis.  Clovis Pu, RN

## 2021-04-12 NOTE — L&D Delivery Note (Signed)
OB/GYN Faculty Practice Delivery Note ? ?Gloria Mann is a 31 y.o. [id number] s/p VD at [redacted]w[redacted]d. She was admitted for IOL due to cholestasis with bile acids 123.  ? ?ROM: 4h 64m with clear fluid ?GBS Status: POSITIVE/-- (05/16 1745) ?Maximum Maternal Temperature: 98.85F ? ?Labor Progress: ?Initial SVE: 3/50/-3. She then progressed to complete with assistance of cytotec and AROM.   ? ?Delivery Date/Time: 1117  ?Delivery: Called to room and patient was complete. Pushed for approximately 15 minutes. FOB assisted with delivery of infant. Head delivered L OA. No nuchal cord present. Shoulder and body delivered in usual fashion. Infant with spontaneous cry, placed on mother's abdomen, dried and stimulated. Cord clamped x 2 after 1-minute delay, and cut by FOB. Cord blood drawn. Placenta delivered spontaneously with gentle cord traction. Fundus firm with massage, lower uterine sweep, and Pitocin. Labia, perineum, vagina, and cervix inspected with no lacerations. I&O cath performed with 50 cc clear urine.  ?Baby Weight: pending ? ?Placenta: 3 vessel, intact. Sent to L&D ?Complications: None ?Lacerations: None ?EBL: 75 mL ?Analgesia: Epidural  ? ?Infant:  ?APGAR (1 MIN): 7 ?APGAR (5 MINS): 9 ? ?Leticia Penna, DO  ?[facility] Fellow, Faculty Practice ?Center for [employer], [facility] ?08/26/2021, 11:41 AM ? ? ? ?  ?

## 2021-04-22 ENCOUNTER — Encounter (HOSPITAL_COMMUNITY): Payer: Self-pay | Admitting: Obstetrics & Gynecology

## 2021-04-22 ENCOUNTER — Inpatient Hospital Stay (HOSPITAL_BASED_OUTPATIENT_CLINIC_OR_DEPARTMENT_OTHER): Payer: Medicaid Other

## 2021-04-22 ENCOUNTER — Inpatient Hospital Stay (HOSPITAL_COMMUNITY)
Admission: AD | Admit: 2021-04-22 | Discharge: 2021-04-22 | Disposition: A | Discharge status: Home / Self Care | Payer: Medicaid Other | Attending: Obstetrics & Gynecology | Admitting: Obstetrics & Gynecology

## 2021-04-22 ENCOUNTER — Other Ambulatory Visit (HOSPITAL_COMMUNITY)
Admission: RE | Admit: 2021-04-22 | Discharge: 2021-04-22 | Disposition: A | Discharge status: Home / Self Care | Payer: Medicaid Other | Source: Ambulatory Visit | Attending: Certified Nurse Midwife | Admitting: Certified Nurse Midwife

## 2021-04-22 ENCOUNTER — Ambulatory Visit (INDEPENDENT_AMBULATORY_CARE_PROVIDER_SITE_OTHER): Payer: Medicaid Other | Admitting: Certified Nurse Midwife

## 2021-04-22 ENCOUNTER — Other Ambulatory Visit: Payer: Self-pay

## 2021-04-22 VITALS — BP 97/65 | HR 97 | Temp 97.8°F | Wt 141.2 lb

## 2021-04-22 DIAGNOSIS — R109 Unspecified abdominal pain: Secondary | ICD-10-CM | POA: Diagnosis present

## 2021-04-22 DIAGNOSIS — Z3A18 18 weeks gestation of pregnancy: Secondary | ICD-10-CM | POA: Diagnosis not present

## 2021-04-22 DIAGNOSIS — O26899 Other specified pregnancy related conditions, unspecified trimester: Secondary | ICD-10-CM | POA: Diagnosis not present

## 2021-04-22 DIAGNOSIS — O26892 Other specified pregnancy related conditions, second trimester: Secondary | ICD-10-CM | POA: Diagnosis not present

## 2021-04-22 DIAGNOSIS — R519 Headache, unspecified: Secondary | ICD-10-CM | POA: Insufficient documentation

## 2021-04-22 DIAGNOSIS — R102 Pelvic and perineal pain: Secondary | ICD-10-CM | POA: Diagnosis not present

## 2021-04-22 DIAGNOSIS — Z3480 Encounter for supervision of other normal pregnancy, unspecified trimester: Secondary | ICD-10-CM

## 2021-04-22 DIAGNOSIS — S334XXA Traumatic rupture of symphysis pubis, initial encounter: Secondary | ICD-10-CM

## 2021-04-22 DIAGNOSIS — O09892 Supervision of other high risk pregnancies, second trimester: Secondary | ICD-10-CM

## 2021-04-22 HISTORY — DX: Dermatitis, unspecified: L30.9

## 2021-04-22 HISTORY — DX: Anxiety disorder, unspecified: F41.9

## 2021-04-22 HISTORY — DX: Urinary tract infection, site not specified: N39.0

## 2021-04-22 LAB — URINALYSIS, ROUTINE W REFLEX MICROSCOPIC
Bilirubin Urine: NEGATIVE
Glucose, UA: NEGATIVE mg/dL
Hgb urine dipstick: NEGATIVE
Ketones, ur: NEGATIVE mg/dL
Nitrite: NEGATIVE
Protein, ur: NEGATIVE mg/dL
Specific Gravity, Urine: 1.02 (ref 1.005–1.030)
pH: 7 (ref 5.0–8.0)

## 2021-04-22 LAB — URINALYSIS, MICROSCOPIC (REFLEX)

## 2021-04-22 MED ORDER — ACETAMINOPHEN 500 MG PO TABS
1000.0000 mg | ORAL_TABLET | Freq: Once | ORAL | Status: AC
  Administered 2021-04-22: 1000 mg via ORAL
  Filled 2021-04-22: qty 2

## 2021-04-22 MED ORDER — CYCLOBENZAPRINE HCL 5 MG PO TABS
10.0000 mg | ORAL_TABLET | Freq: Once | ORAL | Status: AC
  Administered 2021-04-22: 10 mg via ORAL
  Filled 2021-04-22: qty 2

## 2021-04-22 NOTE — MAU Provider Note (Signed)
History     CSN: [id number]  Arrival date and time: 04/22/21 1812   Event Date/Time   First Provider Initiated Contact with Patient 04/22/21 1848      Chief Complaint  Patient presents with   Pelvic Pain   Headache   Dizziness   HPI  [name] is a 31 y.o. female [id number] @ 76w1dhere in MAU with pelvic pain and headache. She has a history of migraine HA's. She reports dull headache that started today. She has not tried anything for the pain. She feels her headache in the front and back of her head. This is not as bad as her migraines that she typically has. She has no photosensitivity or N/V.   She reports a ripping sensation in lower pelvis. This is not a new problem. She felt this with other pregnancies. She feels the pain in the middle of her abdomen. The pain worsens when she is laying down or standing up. The pain does not radiate. The pain comes and goes. Nothing makes the pain better. She has not tried anything for pain.   OB History     Gravida  5   Para  2   Term  0   Preterm  2   AB  2   Living  2      SAB  1   IAB  1   Ectopic  0   Multiple      Live Births  2        Obstetric Comments  Daughter was born at 374 weekspt had ICP Son was born at 371 weekspt had ICP  [place]         Past Medical History:  Diagnosis Date   Anxiety    Eczema    Headache    Herpes genitalis    Intrahepatic cholestasis of pregnancy    UTI (urinary tract infection)     Past Surgical History:  Procedure Laterality Date   NO PAST SURGERIES      Family History  Problem Relation Age of Onset   Depression Mother    Cancer Mother        breast   High Cholesterol Mother    Thyroid disease Mother    Bipolar disorder Mother    Hyperlipidemia Father    Hypertension Father    Diabetes Paternal Aunt    Colon cancer Paternal Uncle     Social History   Tobacco Use   Smoking status: Never   Smokeless tobacco: Never   Vaping Use   Vaping Use: Never used  Substance Use Topics   Alcohol use: Not Currently   Drug use: Not Currently    Types: Marijuana    Comment: last time smoked 12/25/2020    Allergies:  Allergies  Allergen Reactions   Eggs Or Egg-Derived Products Rash    Other reaction(s): Unknown   Morphine And Related     Doesn't remember. Thinks she had itching and maybe her lips swelled     Medications Prior to Admission  Medication Sig Dispense Refill Last Dose   metroNIDAZOLE (FLAGYL) 500 MG tablet Take 1 tablet (500 mg total) by mouth 2 (two) times daily. Take with food. 14 tablet 0 04/21/2021   Prenatal Vit-Fe Fumarate-FA (PREPLUS) 27-1 MG TABS Take 1 tablet by mouth daily. 30 tablet 12 04/22/2021   Acetaminophen-Caffeine 500-65 MG TABS Take 2 tablets by mouth every 8 (eight) hours as needed. (  Patient not taking: Reported on 03/26/2021) 60 tablet 0    Blood Pressure Monitoring (BLOOD PRESSURE KIT) DEVI Z34.90 Use for Bllod pressure Monitoring as needed (Patient not taking: Reported on 03/26/2021) 1 each 0    cyclobenzaprine (FLEXERIL) 10 MG tablet Take 1 tablet (10 mg total) by mouth every 8 (eight) hours as needed (headache). (Patient not taking: Reported on 03/26/2021) 20 tablet 0    metoCLOPramide (REGLAN) 10 MG tablet Take 1 tablet (10 mg total) by mouth every 6 (six) hours as needed for nausea. (Patient not taking: Reported on 02/23/2021) 30 tablet 1    promethazine (PHENERGAN) 25 MG tablet Take 1 tablet (25 mg total) by mouth every 6 (six) hours as needed for nausea or vomiting. (Patient not taking: Reported on 02/23/2021) 30 tablet 0    scopolamine (TRANSDERM-SCOP) 1 MG/3DAYS Place 1 patch (1.5 mg total) onto the skin every 3 (three) days. (Patient not taking: Reported on 02/23/2021) 10 patch 1    ursodiol (ACTIGALL) 500 MG tablet Take 1 tablet (500 mg total) by mouth 2 (two) times daily. (Patient not taking: Reported on 04/22/2021) 60 tablet 3    Results for orders placed or  performed during the hospital encounter of 04/22/21 (from the past 48 hour(s))  Urinalysis, Routine w reflex microscopic Urine, Clean Catch     Status: Abnormal   Collection Time: 04/22/21  7:00 PM  Result Value Ref Range   Color, Urine YELLOW YELLOW   APPearance CLEAR CLEAR   Specific Gravity, Urine 1.020 1.005 - 1.030   pH 7.0 5.0 - 8.0   Glucose, UA NEGATIVE NEGATIVE mg/dL   Hgb urine dipstick NEGATIVE NEGATIVE   Bilirubin Urine NEGATIVE NEGATIVE   Ketones, ur NEGATIVE NEGATIVE mg/dL   Protein, ur NEGATIVE NEGATIVE mg/dL   Nitrite NEGATIVE NEGATIVE   Leukocytes,Ua TRACE (A) NEGATIVE    Comment: Performed at [facility] [address] [place], [place] [address]  Urinalysis, Microscopic (reflex)     Status: Abnormal   Collection Time: 04/22/21  7:00 PM  Result Value Ref Range   RBC / HPF 0-5 0 - 5 RBC/hpf   WBC, UA 6-10 0 - 5 WBC/hpf   Bacteria, UA MANY (A) NONE SEEN   Squamous Epithelial / LPF 11-20 0 - 5   Mucus PRESENT     Comment: Performed at [facility], [place] [address], [place], [place] [address]    Review of Systems  Constitutional:  Negative for fever.  Eyes:  Negative for photophobia and visual disturbance.  Gastrointestinal:  Positive for abdominal pain.  Genitourinary:  Negative for dysuria.  Neurological:  Positive for headaches. Negative for weakness.  Physical Exam   Blood pressure 104/61, pulse 80, temperature 99.1 F (37.3 C), temperature source Oral, resp. rate 18, height 5' 3.5" (1.613 m), weight 63.8 kg, last menstrual period 12/16/2020, SpO2 100 %.  Physical Exam Constitutional:      General: She is not in acute distress.    Appearance: She is well-developed. She is not ill-appearing, toxic-appearing or diaphoretic.  HENT:     Head: Normocephalic.  Abdominal:     Palpations: Abdomen is soft.     Tenderness: There is abdominal tenderness in the suprapubic area. There is no guarding or rebound.     Hernia: There is no hernia in the  umbilical area or ventral area.  Musculoskeletal:        General: Normal range of motion.  Neurological:     Mental Status: She  is alert and oriented to person, place, and time.     GCS: GCS eye subscore is 4. GCS verbal subscore is 5. GCS motor subscore is 6.  Psychiatric:        Behavior: Behavior normal.   MAU Course  Procedures None  MDM  Flexeril & Tylenol given. Patient reports relief in pelvic pain and HA. [place] reassuring. Cervical length 4.2 cm + fetal heart tones.   Assessment and Plan   A:  1. Dislocation of symphysis pubis, initial encounter   2. Pelvic pain in pregnancy, antepartum, second trimester   3. [redacted] weeks gestation of pregnancy   4. Pregnancy headache in second trimester      P:  Discharge home in stable condition Pregnancy support belt encouraged Would consider Pelvic PT if pain continues Urine culture pending Return to MAU if symptoms worsen Ok to use tylenol as directed on the bottle.  Lezlie Lye, NP 04/22/2021 8:38 PM

## 2021-04-22 NOTE — Progress Notes (Signed)
Venia Carbon NP in to discuss test results and d/c plan. Written and verbal d/c instructions given and understanding voiced

## 2021-04-22 NOTE — MAU Note (Signed)
Went to dr earlier.   Met with CNM.  Expressing she has been feeling "ripping" in pelvis.  Has been happening at night, but not every night.  BP was low in office today.  Started feeling dizzy at home, then started getting a HA>hasn't taken anything for the HA, doesn't really like to take medicine.  Was just trying to fall asleep, but the ripping feeling isn't letting her. Feels it mainly when laying down or standing up.

## 2021-04-22 NOTE — Progress Notes (Signed)
° °  PRENATAL VISIT NOTE  Subjective:  Gloria Mann is a 31 y.o. [phone] at [redacted]w[redacted]d being seen today for ongoing prenatal care.  She is currently monitored for the following issues for this high-risk pregnancy and has Supervision of other normal pregnancy, antepartum; History of preterm delivery, currently pregnant; and History of cholestasis during pregnancy on their problem list.  Patient reports  having nighttime cramping that sometimes wakes her up. Not severe and is not consistently every night. Was treated for BV, has not noted any additional discharge or vaginal discomfort but is having the cramping. Notes this has been normal for her in her pregnancies but wonders if this is why she always has preterm deliveries. Took Makena through her last pregnancy and would like to do so again .  Contractions: Not present. Vag. Bleeding: None.  Movement: Present. Denies leaking of fluid.   The following portions of the patient's history were reviewed and updated as appropriate: allergies, current medications, past family history, past medical history, past social history, past surgical history and problem list.   Objective:   Vitals:   04/22/21 0849  BP: 97/65  Pulse: 97  Temp: 97.8 F (36.6 C)  Weight: 141 lb 3.2 oz (64 kg)    Fetal Status: Fetal Heart Rate (bpm): 160   Movement: Present     General:  Alert, oriented and cooperative. Patient is in no acute distress.  Skin: Skin is warm and dry. No rash noted.   Cardiovascular: Normal heart rate noted  Respiratory: Normal respiratory effort, no problems with respiration noted  Abdomen: Soft, gravid, appropriate for gestational age.  Pain/Pressure: Present     Pelvic: Cervical exam deferred        Extremities: Normal range of motion.  Edema: None  Mental Status: Normal mood and affect. Normal behavior. Normal judgment and thought content.   Assessment and Plan:  Pregnancy: [id number] at [redacted]w[redacted]d 1. Supervision of other normal pregnancy,  antepartum - Doing well overall, starting to feel fetal movement. Is more tired and gets headaches more often with this pregnancy - Stressed importance of regular meals and good hydration as well as stress modulation  2. [redacted] weeks gestation of pregnancy - Routine OB care  - AFP, Serum, Open Spina Bifida  3. History of preterm delivery, currently pregnant in second trimester - Makena paperwork filled out, will start weekly injections as soon as meds are received by the office  4. Abdominal cramping affecting pregnancy - Will screen for possible yeast just in case this developed after her BV infection and is causing the nighttime cramping. - Cervicovaginal ancillary only( [place])  Preterm labor symptoms and general obstetric precautions including but not limited to vaginal bleeding, contractions, leaking of fluid and fetal movement were reviewed in detail with the patient. Please refer to After Visit Summary for other counseling recommendations.   Return in about 2 weeks (around 05/06/2021) for IN-PERSON, LOB.  Future Appointments  Date Time Provider [place]  04/29/2021  8:30 AM [facility] NURSE [facility]  04/29/2021  8:45 AM [facility] US5 [facility]  05/21/2021  9:35 AM Laury Deep, CNM [facility] None  05/29/2021 10:45 AM [facility] CLINIC[facility]  06/25/2021  8:15 AM Laury Deep, CNM [facility] None  07/08/2021  8:55 AM Gabriel Carina, CNM [facility] None    Gabriel Carina, CNM

## 2021-04-23 ENCOUNTER — Telehealth: Payer: Self-pay | Admitting: *Deleted

## 2021-04-23 LAB — CERVICOVAGINAL ANCILLARY ONLY
Bacterial Vaginitis (gardnerella): NEGATIVE
Candida Glabrata: NEGATIVE
Candida Vaginitis: NEGATIVE
Comment: NEGATIVE
Comment: NEGATIVE
Comment: NEGATIVE

## 2021-04-23 NOTE — Telephone Encounter (Signed)
Return call to patient. She reported she went to MAU last night regarding the feeling "ripping" in pelvis.  Has been happening at night, but not every night. She was told that she should get a maternity belt. Advised patient to on [place] to purchase one or to a medical supply pharmacy. If she needed a prescription to let the nurse know and one will be provided.   Patient also stated Makena called yesterday stating they were shipping her injections to the clinic. She wanted to know when she can start the injections. Advised patient, once the nurse receives the shipment she will get a call to come into clinic for the injection.   Clovis Pu, RN

## 2021-04-23 NOTE — Telephone Encounter (Signed)
-----   Message from Bernerd Limbo, [place] sent at 04/23/2021  3:04 PM EST ----- Regarding: RE: Im off today (and for some reason I cant get my phone to block my number), but can call her back tomorrow. Did she say if it was the nausea/vomiting getting worse? If so, she can go to MAU for meds/fluids. ----- Message ----- From: Clovis Pu, RN Sent: 04/23/2021   2:15 PM EST To: Bernerd Limbo, CNM  Patient would like for you to call her regarding concerns that you two spoke about yesterday. Reporting they are getting worse.  Clovis Pu, RN

## 2021-04-24 LAB — AFP, SERUM, OPEN SPINA BIFIDA
AFP MoM: 1.1
AFP Value: 57.5 ng/mL
Gest. Age on Collection Date: 18 weeks
Maternal Age At EDD: 29.8 yr
OSBR Risk 1 IN: 10000
Test Results:: NEGATIVE
Weight: 141 [lb_av]

## 2021-04-24 LAB — CULTURE, OB URINE: Culture: 90000 — AB

## 2021-04-25 ENCOUNTER — Other Ambulatory Visit: Payer: Self-pay | Admitting: Obstetrics and Gynecology

## 2021-04-25 ENCOUNTER — Encounter: Payer: Self-pay | Admitting: Obstetrics and Gynecology

## 2021-04-25 DIAGNOSIS — O2342 Unspecified infection of urinary tract in pregnancy, second trimester: Secondary | ICD-10-CM

## 2021-04-25 MED ORDER — CEFADROXIL 500 MG PO CAPS
500.0000 mg | ORAL_CAPSULE | Freq: Two times a day (BID) | ORAL | 0 refills | Status: DC
Start: 2021-04-25 — End: 2021-04-29

## 2021-04-29 ENCOUNTER — Other Ambulatory Visit: Payer: Self-pay | Admitting: *Deleted

## 2021-04-29 ENCOUNTER — Encounter: Payer: Self-pay | Admitting: *Deleted

## 2021-04-29 ENCOUNTER — Ambulatory Visit: Payer: Medicaid Other | Admitting: *Deleted

## 2021-04-29 ENCOUNTER — Ambulatory Visit: Payer: Medicaid Other | Attending: Obstetrics and Gynecology

## 2021-04-29 ENCOUNTER — Telehealth: Payer: Self-pay | Admitting: *Deleted

## 2021-04-29 ENCOUNTER — Other Ambulatory Visit: Payer: Self-pay

## 2021-04-29 VITALS — BP 96/52 | HR 81

## 2021-04-29 DIAGNOSIS — O0992 Supervision of high risk pregnancy, unspecified, second trimester: Secondary | ICD-10-CM | POA: Diagnosis present

## 2021-04-29 DIAGNOSIS — O09899 Supervision of other high risk pregnancies, unspecified trimester: Secondary | ICD-10-CM | POA: Diagnosis present

## 2021-04-29 DIAGNOSIS — O09212 Supervision of pregnancy with history of pre-term labor, second trimester: Secondary | ICD-10-CM | POA: Diagnosis not present

## 2021-04-29 DIAGNOSIS — Z8759 Personal history of other complications of pregnancy, childbirth and the puerperium: Secondary | ICD-10-CM

## 2021-04-29 DIAGNOSIS — Z8719 Personal history of other diseases of the digestive system: Secondary | ICD-10-CM

## 2021-04-29 DIAGNOSIS — Z3480 Encounter for supervision of other normal pregnancy, unspecified trimester: Secondary | ICD-10-CM | POA: Diagnosis present

## 2021-04-29 DIAGNOSIS — O26612 Liver and biliary tract disorders in pregnancy, second trimester: Secondary | ICD-10-CM | POA: Diagnosis not present

## 2021-04-29 DIAGNOSIS — K831 Obstruction of bile duct: Secondary | ICD-10-CM | POA: Diagnosis not present

## 2021-04-29 DIAGNOSIS — Z3A19 19 weeks gestation of pregnancy: Secondary | ICD-10-CM | POA: Insufficient documentation

## 2021-04-29 NOTE — Telephone Encounter (Signed)
Left patient a voice message stating Makena injection is available in office. Patient to call for an appointment.  Clovis Pu, RN

## 2021-04-30 ENCOUNTER — Other Ambulatory Visit: Payer: Self-pay

## 2021-05-06 ENCOUNTER — Ambulatory Visit: Payer: Medicaid Other | Admitting: Psychiatry

## 2021-05-15 NOTE — BH Specialist Note (Signed)
Pt did not arrive to video visit and did not answer the phone; Unable to leave voice message as mailbox is full; left MyChart message for patient.   

## 2021-05-18 ENCOUNTER — Telehealth: Payer: Self-pay

## 2021-05-18 NOTE — Telephone Encounter (Signed)
Spoke to [employer] at [facility]. Merlyn Albert states pt's Makena 275 ml auto injectors will be delivered on 05/20/2021.  Rutger Salton l Stephens Sh[place], CMA

## 2021-05-20 ENCOUNTER — Inpatient Hospital Stay (HOSPITAL_COMMUNITY): Payer: Medicaid Other

## 2021-05-20 ENCOUNTER — Encounter (HOSPITAL_COMMUNITY): Payer: Self-pay | Admitting: Family Medicine

## 2021-05-20 ENCOUNTER — Other Ambulatory Visit: Payer: Self-pay

## 2021-05-20 ENCOUNTER — Inpatient Hospital Stay (HOSPITAL_COMMUNITY)
Admission: AD | Admit: 2021-05-20 | Discharge: 2021-05-20 | Disposition: A | Discharge status: Home / Self Care | Payer: Medicaid Other | Attending: Family Medicine | Admitting: Family Medicine

## 2021-05-20 DIAGNOSIS — Z3A22 22 weeks gestation of pregnancy: Secondary | ICD-10-CM | POA: Insufficient documentation

## 2021-05-20 DIAGNOSIS — O99352 Diseases of the nervous system complicating pregnancy, second trimester: Secondary | ICD-10-CM | POA: Diagnosis not present

## 2021-05-20 DIAGNOSIS — O26892 Other specified pregnancy related conditions, second trimester: Secondary | ICD-10-CM | POA: Insufficient documentation

## 2021-05-20 DIAGNOSIS — Z8759 Personal history of other complications of pregnancy, childbirth and the puerperium: Secondary | ICD-10-CM

## 2021-05-20 DIAGNOSIS — R519 Headache, unspecified: Secondary | ICD-10-CM

## 2021-05-20 DIAGNOSIS — R0789 Other chest pain: Secondary | ICD-10-CM

## 2021-05-20 DIAGNOSIS — O99012 Anemia complicating pregnancy, second trimester: Secondary | ICD-10-CM | POA: Insufficient documentation

## 2021-05-20 DIAGNOSIS — R0609 Other forms of dyspnea: Secondary | ICD-10-CM | POA: Diagnosis not present

## 2021-05-20 DIAGNOSIS — O26812 Pregnancy related exhaustion and fatigue, second trimester: Secondary | ICD-10-CM | POA: Insufficient documentation

## 2021-05-20 DIAGNOSIS — Z20822 Contact with and (suspected) exposure to covid-19: Secondary | ICD-10-CM | POA: Diagnosis not present

## 2021-05-20 DIAGNOSIS — Z8719 Personal history of other diseases of the digestive system: Secondary | ICD-10-CM

## 2021-05-20 DIAGNOSIS — G43909 Migraine, unspecified, not intractable, without status migrainosus: Secondary | ICD-10-CM | POA: Insufficient documentation

## 2021-05-20 DIAGNOSIS — R0602 Shortness of breath: Secondary | ICD-10-CM | POA: Diagnosis not present

## 2021-05-20 DIAGNOSIS — I951 Orthostatic hypotension: Secondary | ICD-10-CM | POA: Diagnosis not present

## 2021-05-20 LAB — CBC
HCT: 29.7 % — ABNORMAL LOW (ref 36.0–46.0)
Hemoglobin: 10.4 g/dL — ABNORMAL LOW (ref 12.0–15.0)
MCH: 33.7 pg (ref 26.0–34.0)
MCHC: 35 g/dL (ref 30.0–36.0)
MCV: 96.1 fL (ref 80.0–100.0)
Platelets: 230 10*3/uL (ref 150–400)
RBC: 3.09 MIL/uL — ABNORMAL LOW (ref 3.87–5.11)
RDW: 12.6 % (ref 11.5–15.5)
WBC: 6.9 10*3/uL (ref 4.0–10.5)
nRBC: 0 % (ref 0.0–0.2)

## 2021-05-20 LAB — URINALYSIS, ROUTINE W REFLEX MICROSCOPIC
Bilirubin Urine: NEGATIVE
Glucose, UA: NEGATIVE mg/dL
Hgb urine dipstick: NEGATIVE
Ketones, ur: NEGATIVE mg/dL
Nitrite: NEGATIVE
Protein, ur: NEGATIVE mg/dL
Specific Gravity, Urine: 1.013 (ref 1.005–1.030)
pH: 5 (ref 5.0–8.0)

## 2021-05-20 LAB — COMPREHENSIVE METABOLIC PANEL
ALT: 20 U/L (ref 0–44)
AST: 23 U/L (ref 15–41)
Albumin: 2.9 g/dL — ABNORMAL LOW (ref 3.5–5.0)
Alkaline Phosphatase: 74 U/L (ref 38–126)
Anion gap: 6 (ref 5–15)
BUN: 8 mg/dL (ref 6–20)
CO2: 23 mmol/L (ref 22–32)
Calcium: 8.9 mg/dL (ref 8.9–10.3)
Chloride: 104 mmol/L (ref 98–111)
Creatinine, Ser: 0.68 mg/dL (ref 0.44–1.00)
GFR, Estimated: >60 mL/min (ref >60)
Glucose, Bld: 83 mg/dL (ref 70–99)
Potassium: 3.7 mmol/L (ref 3.5–5.1)
Sodium: 133 mmol/L — ABNORMAL LOW (ref 135–145)
Total Bilirubin: 0.4 mg/dL (ref 0.3–1.2)
Total Protein: 6.1 g/dL — ABNORMAL LOW (ref 6.5–8.1)

## 2021-05-20 LAB — RESP PANEL BY RT-PCR (FLU A&B, COVID) ARPGX2
Influenza A by PCR: NEGATIVE
Influenza B by PCR: NEGATIVE
SARS Coronavirus 2 by RT PCR: NEGATIVE

## 2021-05-20 LAB — TSH: TSH: 1.046 u[IU]/mL (ref 0.350–4.500)

## 2021-05-20 LAB — MAGNESIUM: Magnesium: 2 mg/dL (ref 1.7–2.4)

## 2021-05-20 MED ORDER — DEXAMETHASONE SODIUM PHOSPHATE 10 MG/ML IJ SOLN
10.0000 mg | Freq: Once | INTRAMUSCULAR | Status: AC
  Administered 2021-05-20: 10 mg via INTRAVENOUS
  Filled 2021-05-20: qty 1

## 2021-05-20 MED ORDER — DIPHENHYDRAMINE HCL 50 MG/ML IJ SOLN
25.0000 mg | Freq: Once | INTRAMUSCULAR | Status: AC
  Administered 2021-05-20: 25 mg via INTRAVENOUS
  Filled 2021-05-20: qty 1

## 2021-05-20 MED ORDER — LACTATED RINGERS IV SOLN: Freq: Once | INTRAVENOUS | Status: AC

## 2021-05-20 MED ORDER — METOCLOPRAMIDE HCL 5 MG/ML IJ SOLN
10.0000 mg | Freq: Once | INTRAMUSCULAR | Status: AC
  Administered 2021-05-20: 10 mg via INTRAVENOUS
  Filled 2021-05-20: qty 2

## 2021-05-20 NOTE — MAU Note (Signed)
..  Gloria Mann is a 31 y.o. at [redacted]w[redacted]d here in MAU reporting: Pt has right sided chest pain, blurred vision, SOB with activity, increase fatigue pt states its hard for her to stay awake, and some swelling feet over the weekend that went away.  Pt reports that her heart feels like its beating out her chest. Pt states the s/s started last week with the fatigue and swelling. Pt reports she has ICP. Pt called her OB office and spoke to the nurse. Pt did not take her BP at home, just called for the s/s. Pt states baby is not moving as much today. Pt denies VB, LOF, and abnormal discharge.   Onset of complaint: 3 days Pain score: 7/10 Vitals:   05/20/21 2007 05/20/21 2008  BP:  (!) 109/59  Pulse:  (!) 104  Resp:  18  Temp:  98.5 F (36.9 C)  SpO2: 100%      FHT:165 Lab orders placed from triage:  UA

## 2021-05-20 NOTE — MAU Provider Note (Signed)
Chief Complaint:  Hypertension   Event Date/Time   First Provider Initiated Contact with Patient 05/20/21 2026     HPI: Gloria Mann is a 31 y.o. [id number] at 34w1dwho presents to maternity admissions reporting increased fatigue with any exertion. At night feels heart poundiing.  Also has some right sided chest wall pain, shortness of breath with exertion, and swelling of feet. States has had these symptoms throughout pregnancy but they have worsened over the past 2 weeks.. She denies LOF, vaginal bleeding, n/v, or fever/chills.  .  Has not mentioned this to office providers  Other This is a recurrent problem. The current episode started 1 to 4 weeks ago. The problem occurs constantly. The problem has been gradually worsening. Associated symptoms include chest pain (chest wall pain right lower 11th rib), fatigue, headaches (typical of her migraines) and weakness. Pertinent negatives include no abdominal pain, chills, congestion, coughing, fever, myalgias, nausea, numbness, vertigo, visual change or vomiting. The symptoms are aggravated by walking and exertion. She has tried nothing for the symptoms.   RN note: Gloria Mann is a 31 y.o. at [redacted]w[redacted]d here in MAU reporting: Pt has right sided chest pain, blurred vision, SOB with activity, increase fatigue pt states its hard for her to stay awake, and some swelling feet over the weekend that went away.  Pt reports that her heart feels like its beating out her chest. Pt states the s/s started last week with the fatigue and swelling. Pt reports she has ICP. Pt called her OB office and spoke to the nurse. Pt did not take her BP at home, just called for the s/s. Pt states baby is not moving as much today. Pt denies VB, LOF, and abnormal discharge.   Past Medical History: Past Medical History:  Diagnosis Date   Anxiety    Eczema    Headache    Herpes genitalis    Intrahepatic cholestasis of pregnancy    UTI (urinary tract infection)     Past obstetric  history: OB History  Gravida Para Term Preterm AB Living  5 2 0 $R'2 2 2  'gL$ SAB IAB Ectopic Multiple Live Births  1 1 0   2    # Outcome Date GA Lbr Len/2nd Weight Sex Delivery Anes PTL Lv  5 Current           4 Preterm 08/30/15 [redacted]w[redacted]d   M Vag-Spont   LIV  3 IAB 2015          2 Preterm 01/13/13 106w0d   F Vag-Spont   LIV  1 SAB 2013            Obstetric Comments  Daughter was born at 1 weeks pt had ICP  Son was born at 25 weeks pt had ICP   Both born at [facility] in [place]     Past Surgical History: Past Surgical History:  Procedure Laterality Date   NO PAST SURGERIES      Family History: Family History  Problem Relation Age of Onset   Depression Mother    Cancer Mother        breast   High Cholesterol Mother    Thyroid disease Mother    Bipolar disorder Mother    Hyperlipidemia Father    Hypertension Father    Diabetes Paternal Aunt    Colon cancer Paternal Uncle     Social History: Social History   Tobacco Use   Smoking status: Never   Smokeless tobacco: Never  Vaping Use  Vaping Use: Never used  Substance Use Topics   Alcohol use: Not Currently   Drug use: Not Currently    Types: Marijuana    Comment: last time smoked 12/25/2020    Allergies:  Allergies  Allergen Reactions   Eggs Or Egg-Derived Products Rash    Other reaction(s): Unknown   Morphine And Related     Doesn't remember. Thinks she had itching and maybe her lips swelled     Meds:  Medications Prior to Admission  Medication Sig Dispense Refill Last Dose   Acetaminophen-Caffeine 500-65 MG TABS Take 2 tablets by mouth every 8 (eight) hours as needed. (Patient not taking: Reported on 03/26/2021) 60 tablet 0    Blood Pressure Monitoring (BLOOD PRESSURE KIT) DEVI Z34.90 Use for Bllod pressure Monitoring as needed (Patient not taking: Reported on 03/26/2021) 1 each 0    cyclobenzaprine (FLEXERIL) 10 MG tablet Take 1 tablet (10 mg total) by mouth every 8 (eight) hours as needed (headache).  (Patient not taking: Reported on 03/26/2021) 20 tablet 0    Prenatal Vit-Fe Fumarate-FA (PREPLUS) 27-1 MG TABS Take 1 tablet by mouth daily. 30 tablet 12    promethazine (PHENERGAN) 25 MG tablet Take 1 tablet (25 mg total) by mouth every 6 (six) hours as needed for nausea or vomiting. (Patient not taking: Reported on 02/23/2021) 30 tablet 0     I have reviewed patient's Past Medical Hx, Surgical Hx, Family Hx, Social Hx, medications and allergies.   ROS:  Review of Systems  Constitutional:  Positive for fatigue. Negative for chills and fever.  HENT:  Negative for congestion.   Respiratory:  Negative for cough.   Cardiovascular:  Positive for chest pain (chest wall pain right lower 11th rib).  Gastrointestinal:  Negative for abdominal pain, nausea and vomiting.  Musculoskeletal:  Negative for myalgias.  Neurological:  Positive for weakness and headaches (typical of her migraines). Negative for vertigo and numbness.  Other systems negative  Physical Exam  Patient Vitals for the past 24 hrs:  BP Temp Pulse Resp SpO2 Height Weight  05/20/21 2009 -- -- -- -- -- _0  (1.6 m) 67.6 kg  05/20/21 2008 (!) 109/59 98.5 F (36.9 C) (!) 104 18 -- -- --  05/20/21 2007 -- -- -- -- 100 % -- --   Orthostatic VS: HR > 73-84-97-86 BPs no significant change  Constitutional: Well-developed, well-nourished female in no acute distress. Appears fatigued Cardiovascular: normal rate and rhythm, rate 80-90s Respiratory: normal effort, clear to auscultation bilaterally  Slightly tender over right 11th rib anteriorly GI: Abd soft, non-tender, gravid appropriate for gestational age.   No rebound or guarding. MS: Extremities nontender, no edema, normal ROM Neurologic: Alert and oriented x 4.  GU: Neg CVAT.  FHT:  165     Labs: Results for orders placed or performed during the hospital encounter of 05/20/21 (from the past 24 hour(s))  Urinalysis, Routine w reflex microscopic Urine, Clean Catch     Status:  Abnormal   Collection Time: 05/20/21  7:54 PM  Result Value Ref Range   Color, Urine YELLOW YELLOW   APPearance HAZY (A) CLEAR   Specific Gravity, Urine 1.013 1.005 - 1.030   pH 5.0 5.0 - 8.0   Glucose, UA NEGATIVE NEGATIVE mg/dL   Hgb urine dipstick NEGATIVE NEGATIVE   Bilirubin Urine NEGATIVE NEGATIVE   Ketones, ur NEGATIVE NEGATIVE mg/dL   Protein, ur NEGATIVE NEGATIVE mg/dL   Nitrite NEGATIVE NEGATIVE   Leukocytes,Ua LARGE (A) NEGATIVE  RBC / HPF 0-5 0 - 5 RBC/hpf   WBC, UA 21-50 0 - 5 WBC/hpf   Bacteria, UA FEW (A) NONE SEEN   Squamous Epithelial / LPF 21-50 0 - 5   Mucus PRESENT   Resp Panel by RT-PCR (Flu A&B, Covid) Nasopharyngeal Swab     Status: None   Collection Time: 05/20/21  8:41 PM   Specimen: Nasopharyngeal Swab; Nasopharyngeal(NP) swabs in vial transport medium  Result Value Ref Range   SARS Coronavirus 2 by RT PCR NEGATIVE NEGATIVE   Influenza A by PCR NEGATIVE NEGATIVE   Influenza B by PCR NEGATIVE NEGATIVE  CBC     Status: Abnormal   Collection Time: 05/20/21  9:34 PM  Result Value Ref Range   WBC 6.9 4.0 - 10.5 K/uL   RBC 3.09 (L) 3.87 - 5.11 MIL/uL   Hemoglobin 10.4 (L) 12.0 - 15.0 g/dL   HCT 29.7 (L) 36.0 - 46.0 %   MCV 96.1 80.0 - 100.0 fL   MCH 33.7 26.0 - 34.0 pg   MCHC 35.0 30.0 - 36.0 g/dL   RDW 12.6 11.5 - 15.5 %   Platelets 230 150 - 400 K/uL   nRBC 0.0 0.0 - 0.2 %  Comprehensive metabolic panel     Status: Abnormal   Collection Time: 05/20/21  9:34 PM  Result Value Ref Range   Sodium 133 (L) 135 - 145 mmol/L   Potassium 3.7 3.5 - 5.1 mmol/L   Chloride 104 98 - 111 mmol/L   CO2 23 22 - 32 mmol/L   Glucose, Bld 83 70 - 99 mg/dL   BUN 8 6 - 20 mg/dL   Creatinine, Ser 0.68 0.44 - 1.00 mg/dL   Calcium 8.9 8.9 - 10.3 mg/dL   Total Protein 6.1 (L) 6.5 - 8.1 g/dL   Albumin 2.9 (L) 3.5 - 5.0 g/dL   AST 23 15 - 41 U/L   ALT 20 0 - 44 U/L   Alkaline Phosphatase 74 38 - 126 U/L   Total Bilirubin 0.4 0.3 - 1.2 mg/dL   GFR, Estimated >60 >60  mL/min   Anion gap 6 5 - 15  Magnesium     Status: None   Collection Time: 05/20/21  9:34 PM  Result Value Ref Range   Magnesium 2.0 1.7 - 2.4 mg/dL  TSH     Status: None   Collection Time: 05/20/21  9:34 PM  Result Value Ref Range   TSH 1.046 0.350 - 4.500 uIU/mL    O/Positive/-- (12/15 0900)  Imaging:  DG Chest Portable 1 View  Result Date: 05/20/2021 CLINICAL DATA:  Shortness of breath and chest pain. EXAM: PORTABLE CHEST 1 VIEW COMPARISON:  None. FINDINGS: The heart size and mediastinal contours are within normal limits. Both lungs are clear, with mildly elevated right hemidiaphragm. The visualized skeletal structures are unremarkable. IMPRESSION: No active disease. Electronically Signed   By: Telford Nab M.D.   On: 05/20/2021 20:49    12 lead EKG Normal sinus rhythm  MAU Course/MDM: I have ordered labs and reviewed results. These are normal except borderline anemia   TSH normal  CXR normal  Oxygen saturations remained normal throuhout monitoring period Slightly orthostatic with HR (though pt states she drinks a lot of water) Consult Dr Elgie Congo with presentation, exam findings and test results.  Treatments in MAU included IV hydration, which helped somewhat.  We gave Reglan, Benadryl and decadron for her migraine which she states helped.    Assessment: Single IUP at  101w1dFatigue, exertional Dyspnea with exertion Borderline anemia Chest wall pain Migraine headache  Plan: Discharge home Recommend po hydration and regular meals Message sent to arrange referral to Dr THarriet Massonfor cardiac evaluation Follow up in Office for prenatal visits, has appt tomorrow Encouraged to return if she develops worsening of symptoms, increase in pain, fever, or other concerning symptoms.  Pt stable at time of discharge.  MHansel FeinsteinCNM, MSN Certified Nurse-Midwife 05/20/2021 8:26 PM

## 2021-05-21 ENCOUNTER — Ambulatory Visit (INDEPENDENT_AMBULATORY_CARE_PROVIDER_SITE_OTHER): Payer: Medicaid Other | Admitting: Obstetrics and Gynecology

## 2021-05-21 ENCOUNTER — Encounter: Payer: Self-pay | Admitting: Obstetrics and Gynecology

## 2021-05-21 VITALS — BP 103/66 | HR 113 | Wt 149.8 lb

## 2021-05-21 DIAGNOSIS — O26812 Pregnancy related exhaustion and fatigue, second trimester: Secondary | ICD-10-CM | POA: Insufficient documentation

## 2021-05-21 DIAGNOSIS — Z3480 Encounter for supervision of other normal pregnancy, unspecified trimester: Secondary | ICD-10-CM

## 2021-05-21 DIAGNOSIS — R0789 Other chest pain: Secondary | ICD-10-CM | POA: Insufficient documentation

## 2021-05-21 DIAGNOSIS — O09892 Supervision of other high risk pregnancies, second trimester: Secondary | ICD-10-CM

## 2021-05-21 MED ORDER — HYDROXYPROGESTERONE CAPROATE 275 MG/1.1ML ~~LOC~~ SOAJ
275.0000 mg | Freq: Once | SUBCUTANEOUS | Status: AC
  Administered 2021-05-21: 275 mg via SUBCUTANEOUS

## 2021-05-21 NOTE — Progress Notes (Signed)
Patient received her Makena (hydroxprogesterone/caproate) 275/1.1 ml injection in left thigh without any complications. Patient will return in 1 week for next injection.  She had no further questions or concerns.   Ellyn Rubiano, CMA

## 2021-05-22 NOTE — Progress Notes (Signed)
° °  LOW-RISK PREGNANCY OFFICE VISIT Patient name: Gloria Mann MRN [id number]  Date of birth: [birth year] Chief Complaint:   Routine Prenatal Visit  History of Present Illness:   Gloria Mann is a 31 y.o. [phone] female at [redacted]w[redacted]d with an Estimated Date of Delivery: 09/22/21 being seen today for ongoing management of a low-risk pregnancy.  Today she reports  she was seen in MAU last night for increased fatigue with any exertion, feeling like heart pounding at night, chest wall pain, SOB with exertion, swelling in feet, and headache,  . Contractions: Not present. Vag. Bleeding: None.  Movement: Present. denies leaking of fluid. Review of Systems:   Pertinent items are noted in HPI Denies abnormal vaginal discharge w/ itching/odor/irritation, headaches, visual changes, shortness of breath, chest pain, abdominal pain, severe nausea/vomiting, or problems with urination or bowel movements unless otherwise stated above. Pertinent History Reviewed:  Reviewed past medical,surgical, social, obstetrical and family history.  Reviewed problem list, medications and allergies. Physical Assessment:   Vitals:   05/21/21 0901  BP: 103/66  Pulse: (!) 113  Weight: 149 lb 12.8 oz (67.9 kg)  Body mass index is 26.54 kg/m.        Physical Examination:   General appearance: Well appearing, and in no distress  Mental status: Alert, oriented to person, place, and time  Skin: Warm & dry  Cardiovascular: Normal heart rate noted  Respiratory: Normal respiratory effort, no distress  Abdomen: Soft, gravid, non-tender, FH: S=D  Pelvic: Cervical exam deferred         Extremities: Edema: None  Fetal Status: Fetal Heart Rate (bpm): 166   Movement: Present    No results found for this or any previous visit (from the past 24 hour(s)).  Assessment & Plan:  1) Low-risk pregnancy [id number] at [redacted]w[redacted]d with an Estimated Date of Delivery: 09/22/21   2) Supervision of other normal pregnancy, antepartum  3) Chest wall pain -  Plan: AMB Referral to Cardio Obstetrics  4) Pregnancy related fatigue in second trimester - Plan: AMB Referral to Cardio Obstetrics  5) History of preterm delivery, currently pregnant in second trimester  -Start weekly HYDROXYprogesterone caproate (Makena) autoinjector 275 mg today   Meds:  Meds ordered this encounter  Medications   HYDROXYprogesterone caproate (Makena) autoinjector 275 mg   Labs/procedures today: none  Plan:  Continue routine obstetrical care   Reviewed: Preterm labor symptoms and general obstetric precautions including but not limited to vaginal bleeding, contractions, leaking of fluid and fetal movement were reviewed in detail with the patient.  All questions were answered. Has home bp cuff. Check bp weekly, let us know if >140/90.   Follow-up: Return in about 2 weeks (around 06/04/2021) for Return OB visit.  Orders Placed This Encounter  Procedures   AMB Referral to Cardio Obstetrics   Raelyn Mora MSN, CNM 05/21/2021 9:35 AM

## 2021-05-28 ENCOUNTER — Other Ambulatory Visit: Payer: Self-pay

## 2021-05-28 ENCOUNTER — Ambulatory Visit (INDEPENDENT_AMBULATORY_CARE_PROVIDER_SITE_OTHER): Payer: Medicaid Other

## 2021-05-28 ENCOUNTER — Telehealth: Payer: Self-pay

## 2021-05-28 ENCOUNTER — Ambulatory Visit: Payer: Medicaid Other

## 2021-05-28 DIAGNOSIS — Z8759 Personal history of other complications of pregnancy, childbirth and the puerperium: Secondary | ICD-10-CM

## 2021-05-28 DIAGNOSIS — Z8719 Personal history of other diseases of the digestive system: Secondary | ICD-10-CM

## 2021-05-28 DIAGNOSIS — Z3480 Encounter for supervision of other normal pregnancy, unspecified trimester: Secondary | ICD-10-CM

## 2021-05-28 DIAGNOSIS — O09899 Supervision of other high risk pregnancies, unspecified trimester: Secondary | ICD-10-CM

## 2021-05-28 MED ORDER — HYDROXYPROGESTERONE CAPROATE 275 MG/1.1ML ~~LOC~~ SOAJ
275.0000 mg | Freq: Once | SUBCUTANEOUS | Status: AC
Start: 2021-05-28 — End: 2021-05-28
  Administered 2021-05-28: 275 mg via SUBCUTANEOUS

## 2021-05-28 NOTE — Telephone Encounter (Signed)
Pt did not keep appt today for Makena injection. Called pt; unable to leave message because VM is full. MyChart message sent.

## 2021-05-28 NOTE — Progress Notes (Signed)
[place] Gover here for 17-P  Injection.  Injection administered without complication. Patient will return in one week for next injection.  Isabell Jarvis, RN 05/28/2021  2:47 PM

## 2021-05-29 ENCOUNTER — Ambulatory Visit: Payer: Medicaid Other | Admitting: Clinical

## 2021-05-29 DIAGNOSIS — Z91199 Patient's noncompliance with other medical treatment and regimen due to unspecified reason: Secondary | ICD-10-CM

## 2021-06-04 ENCOUNTER — Ambulatory Visit (INDEPENDENT_AMBULATORY_CARE_PROVIDER_SITE_OTHER): Payer: Medicaid Other

## 2021-06-04 ENCOUNTER — Other Ambulatory Visit: Payer: Self-pay

## 2021-06-04 VITALS — BP 108/60 | HR 76 | Wt 148.9 lb

## 2021-06-04 DIAGNOSIS — Z3480 Encounter for supervision of other normal pregnancy, unspecified trimester: Secondary | ICD-10-CM

## 2021-06-04 DIAGNOSIS — O09899 Supervision of other high risk pregnancies, unspecified trimester: Secondary | ICD-10-CM

## 2021-06-04 MED ORDER — HYDROXYPROGESTERONE CAPROATE 275 MG/1.1ML ~~LOC~~ SOAJ
275.0000 mg | Freq: Once | SUBCUTANEOUS | Status: AC
  Administered 2021-06-04: 275 mg via SUBCUTANEOUS

## 2021-06-04 NOTE — Progress Notes (Signed)
[place] Bates here for 17-P  Injection.  Injection administered without complication. Patient will return in one week for next injection. Pt has next injection scheduled for 3/2 and is aware.   Isabell Jarvis, RN 06/04/2021  9:59 AM

## 2021-06-08 ENCOUNTER — Encounter: Payer: Self-pay | Admitting: Cardiology

## 2021-06-08 ENCOUNTER — Ambulatory Visit: Payer: Medicaid Other | Admitting: Cardiology

## 2021-06-11 ENCOUNTER — Other Ambulatory Visit (HOSPITAL_COMMUNITY)
Admission: RE | Admit: 2021-06-11 | Discharge: 2021-06-11 | Disposition: A | Discharge status: Home / Self Care | Payer: Medicaid Other | Source: Ambulatory Visit | Attending: Family Medicine | Admitting: Family Medicine

## 2021-06-11 ENCOUNTER — Ambulatory Visit (INDEPENDENT_AMBULATORY_CARE_PROVIDER_SITE_OTHER): Payer: Medicaid Other | Admitting: Family Medicine

## 2021-06-11 ENCOUNTER — Other Ambulatory Visit: Payer: Self-pay

## 2021-06-11 ENCOUNTER — Encounter: Payer: Self-pay | Admitting: Family Medicine

## 2021-06-11 VITALS — BP 98/59 | HR 86 | Wt 155.5 lb

## 2021-06-11 DIAGNOSIS — Z8719 Personal history of other diseases of the digestive system: Secondary | ICD-10-CM

## 2021-06-11 DIAGNOSIS — O09899 Supervision of other high risk pregnancies, unspecified trimester: Secondary | ICD-10-CM

## 2021-06-11 DIAGNOSIS — Z8759 Personal history of other complications of pregnancy, childbirth and the puerperium: Secondary | ICD-10-CM | POA: Diagnosis not present

## 2021-06-11 DIAGNOSIS — Z3480 Encounter for supervision of other normal pregnancy, unspecified trimester: Secondary | ICD-10-CM | POA: Diagnosis present

## 2021-06-11 MED ORDER — HYDROXYPROGESTERONE CAPROATE 275 MG/1.1ML ~~LOC~~ SOAJ
275.0000 mg | Freq: Once | SUBCUTANEOUS | Status: AC
Start: 2021-06-11 — End: 2021-06-11
  Administered 2021-06-11: 275 mg via SUBCUTANEOUS

## 2021-06-11 NOTE — Progress Notes (Signed)
? ?  PRENATAL VISIT NOTE ? ?Subjective:  ?Gloria Mann is a 31 y.o. [phone] at [redacted]w[redacted]d being seen today for ongoing prenatal care.  She is currently monitored for the following issues for this high-risk pregnancy and has Supervision of other normal pregnancy, antepartum; History of preterm delivery, currently pregnant; History of cholestasis during pregnancy; Chest wall pain; and Pregnancy related fatigue in second trimester on their problem list. ? ?Patient reports  abdominal pain and leaking fluid yesterday requiring change of clothes .  Contractions: Irritability. Vag. Bleeding: None.  Movement: Present. Denies leaking of fluid.  ? ?The following portions of the patient's history were reviewed and updated as appropriate: allergies, current medications, past family history, past medical history, past social history, past surgical history and problem list.  ? ?Objective:  ? ?Vitals:  ? 06/11/21 0935  ?BP: (!) 98/59  ?Pulse: 86  ?Weight: 155 lb 8 oz (70.5 kg)  ? ? ?Fetal Status: Fetal Heart Rate (bpm): 158   Movement: Present    ? ?General:  Alert, oriented and cooperative. Patient is in no acute distress.  ?Skin: Skin is warm and dry. No rash noted.   ?Cardiovascular: Normal heart rate noted  ?Respiratory: Normal respiratory effort, no problems with respiration noted  ?Abdomen: Soft, gravid, appropriate for gestational age.  Pain/Pressure: Present     ?Pelvic: Cervical exam performed in the presence of a chaperone Dilation: 1 Effacement (%): Thick Station: Ballotable Yellow discharge Negative pool, negative fern  ?Extremities: Normal range of motion.  Edema: None  ?Mental Status: Normal mood and affect. Normal behavior. Normal judgment and thought content.  ? ?Assessment and Plan:  ?Pregnancy: [id number] at [redacted]w[redacted]d ?1. Supervision of other normal pregnancy, antepartum ?Check wet prep ?No evidence of ROM ?- Cervicovaginal ancillary only( [place]) ? ?2. History of preterm delivery, currently pregnant ?On 17 P - able to  receive today ? ?3. History of cholestasis during pregnancy ? ? ?Preterm labor symptoms and general obstetric precautions including but not limited to vaginal bleeding, contractions, leaking of fluid and fetal movement were reviewed in detail with the patient. ?Please refer to After Visit Summary for other counseling recommendations.  ? ? ?Future Appointments  ?Date Time Provider Department Center  ?06/19/2021  9:55 AM Marny Lowenstein, PA-C [facility]  ?06/19/2021 10:20 AM [facility] NURSE [facility]  ?06/24/2021  8:30 AM [facility]SE [facility]  ?06/24/2021  8:45 AM [facility] US4 [facility]  ? ? ?Reva Bores, MD ? ?

## 2021-06-12 ENCOUNTER — Encounter (HOSPITAL_COMMUNITY): Payer: Self-pay | Admitting: Obstetrics & Gynecology

## 2021-06-12 ENCOUNTER — Inpatient Hospital Stay (HOSPITAL_COMMUNITY)
Admission: AD | Admit: 2021-06-12 | Discharge: 2021-06-12 | Disposition: A | Discharge status: Home / Self Care | Payer: Medicaid Other | Attending: Obstetrics & Gynecology | Admitting: Obstetrics & Gynecology

## 2021-06-12 DIAGNOSIS — N898 Other specified noninflammatory disorders of vagina: Secondary | ICD-10-CM | POA: Diagnosis not present

## 2021-06-12 DIAGNOSIS — Z3A25 25 weeks gestation of pregnancy: Secondary | ICD-10-CM | POA: Diagnosis not present

## 2021-06-12 DIAGNOSIS — M549 Dorsalgia, unspecified: Secondary | ICD-10-CM | POA: Diagnosis not present

## 2021-06-12 DIAGNOSIS — Z3492 Encounter for supervision of normal pregnancy, unspecified, second trimester: Secondary | ICD-10-CM

## 2021-06-12 DIAGNOSIS — O26892 Other specified pregnancy related conditions, second trimester: Secondary | ICD-10-CM | POA: Diagnosis present

## 2021-06-12 DIAGNOSIS — O479 False labor, unspecified: Secondary | ICD-10-CM | POA: Diagnosis not present

## 2021-06-12 LAB — COMPREHENSIVE METABOLIC PANEL
ALT: 23 U/L (ref 0–44)
AST: 22 U/L (ref 15–41)
Albumin: 3.1 g/dL — ABNORMAL LOW (ref 3.5–5.0)
Alkaline Phosphatase: 91 U/L (ref 38–126)
Anion gap: 6 (ref 5–15)
BUN: 8 mg/dL (ref 6–20)
CO2: 23 mmol/L (ref 22–32)
Calcium: 8.8 mg/dL — ABNORMAL LOW (ref 8.9–10.3)
Chloride: 105 mmol/L (ref 98–111)
Creatinine, Ser: 0.52 mg/dL (ref 0.44–1.00)
GFR, Estimated: >60 mL/min (ref >60)
Glucose, Bld: 76 mg/dL (ref 70–99)
Potassium: 3.9 mmol/L (ref 3.5–5.1)
Sodium: 134 mmol/L — ABNORMAL LOW (ref 135–145)
Total Bilirubin: 0.3 mg/dL (ref 0.3–1.2)
Total Protein: 6.4 g/dL — ABNORMAL LOW (ref 6.5–8.1)

## 2021-06-12 LAB — URINALYSIS, ROUTINE W REFLEX MICROSCOPIC
Bilirubin Urine: NEGATIVE
Glucose, UA: NEGATIVE mg/dL
Hgb urine dipstick: NEGATIVE
Ketones, ur: NEGATIVE mg/dL
Nitrite: NEGATIVE
Protein, ur: NEGATIVE mg/dL
Specific Gravity, Urine: 1.018 (ref 1.005–1.030)
pH: 7 (ref 5.0–8.0)

## 2021-06-12 LAB — CERVICOVAGINAL ANCILLARY ONLY
Bacterial Vaginitis (gardnerella): POSITIVE — AB
Candida Glabrata: NEGATIVE
Candida Vaginitis: NEGATIVE
Chlamydia: NEGATIVE
Comment: NEGATIVE
Comment: NEGATIVE
Comment: NEGATIVE
Comment: NEGATIVE
Comment: NEGATIVE
Comment: NORMAL
Neisseria Gonorrhea: NEGATIVE
Trichomonas: NEGATIVE

## 2021-06-12 LAB — CBC
HCT: 30.5 % — ABNORMAL LOW (ref 36.0–46.0)
Hemoglobin: 10.4 g/dL — ABNORMAL LOW (ref 12.0–15.0)
MCH: 33.3 pg (ref 26.0–34.0)
MCHC: 34.1 g/dL (ref 30.0–36.0)
MCV: 97.8 fL (ref 80.0–100.0)
Platelets: 210 10*3/uL (ref 150–400)
RBC: 3.12 MIL/uL — ABNORMAL LOW (ref 3.87–5.11)
RDW: 12.5 % (ref 11.5–15.5)
WBC: 6.6 10*3/uL (ref 4.0–10.5)
nRBC: 0 % (ref 0.0–0.2)

## 2021-06-12 LAB — AMNISURE RUPTURE OF MEMBRANE (ROM) NOT AT ARMC: Amnisure ROM: NEGATIVE

## 2021-06-12 MED ORDER — METRONIDAZOLE 500 MG PO TABS: 500.0000 mg | ORAL_TABLET | Freq: Two times a day (BID) | ORAL | 0 refills | Status: DC

## 2021-06-12 MED ORDER — CYCLOBENZAPRINE HCL 10 MG PO TABS: 10.0000 mg | ORAL_TABLET | Freq: Two times a day (BID) | ORAL | 0 refills | Status: DC | PRN

## 2021-06-12 MED ORDER — ACETAMINOPHEN 325 MG PO TABS
650.0000 mg | ORAL_TABLET | Freq: Once | ORAL | Status: AC
  Administered 2021-06-12: 650 mg via ORAL
  Filled 2021-06-12: qty 2

## 2021-06-12 MED ORDER — CYCLOBENZAPRINE HCL 5 MG PO TABS
10.0000 mg | ORAL_TABLET | Freq: Once | ORAL | Status: AC
  Administered 2021-06-12: 10 mg via ORAL
  Filled 2021-06-12: qty 2

## 2021-06-12 MED ORDER — LACTATED RINGERS IV BOLUS
1000.0000 mL | Freq: Once | INTRAVENOUS | Status: AC
  Administered 2021-06-12: 1000 mL via INTRAVENOUS

## 2021-06-12 NOTE — Addendum Note (Signed)
Addended by: Reva Bores on: 06/12/2021 02:46 PM ? ? Modules accepted: Orders ? ?

## 2021-06-12 NOTE — MAU Note (Signed)
.  Gloria Mann is a 31 y.o. at [redacted]w[redacted]d here in MAU reporting: that she had 2 ctx this morning and she went to the BR because her underwear was wet. She had clear mucusy discharge.  She has had a total of 5 ctx since 7am. Went to [facility] appointment yesterday and she was 1.5cm dilated. She I also is c/o feeling light headed and weak this morning and having sharp abd pain off and on. Good fetal movement felt.  ?LMP:  ?Onset of complaint: 7am ?Pain score: 7 ?BP 110/66   Pulse 98   Temp 98.1 ?F (36.7 ?C)   Resp 18   LMP 12/16/2020 (Exact Date)  ?  ?FHT:152 ?Lab orders placed from triage:    ?

## 2021-06-12 NOTE — MAU Provider Note (Signed)
?History  ?  ? ?CSN: [id number] ? ?Arrival date and time: 06/12/21 [id number] ? ? Event Date/Time  ? First Provider Initiated Contact with Patient 06/12/21 1140   ?  ? ?Chief Complaint  ?Patient presents with  ? Vaginal Discharge  ? Contractions  ? ?HPI ?Martinique Filip is a 31 y.o. [phone] at [redacted]w[redacted]d who presents to MAU with chief complaint of preterm contractions in the setting of hx of preterm birth x 2. This is a recurrent problem for which patient was evaluated in office yesterday. She endorses feeling two contractions when she woke up this morning. She continues to feel irregular contractions which require her full attention. Pain score on arrival to MAU is 8/10. She feels her contractions in her back. She denies aggravating or alleviating factors. She has not taken medication or tried other treatments for this complaint.  ? ?Patient also c/o abnormal vaginal discharge. This is a recurrent problem. Patient endorses waking up to wet underwear this morning. She is not sure about ongoing leaking of fluid.  ? ?Patient endorses dizziness on arrival to MAU. This is a recurrent problem. Patient also states she has not eaten today.  She denies vaginal bleeding, leaking of fluid, decreased fetal movement, fever, falls, or recent illness.  ? ?Patient receives care with [facility].  ? ?OB History   ? ? Gravida  ?5  ? Para  ?2  ? Term  ?0  ? Preterm  ?2  ? AB  ?2  ? Living  ?2  ?  ? ? SAB  ?1  ? IAB  ?1  ? Ectopic  ?0  ? Multiple  ?   ? Live Births  ?2  ?   ?  ? Obstetric Comments  ?Daughter was born at 41 weeks pt had ICP ?Son was born at 109 weeks pt had ICP  ?Both born at [facility] in [place]   ?  ? ?  ? ? ?Past Medical History:  ?Diagnosis Date  ? Anxiety   ? Eczema   ? Headache   ? Herpes genitalis   ? Intrahepatic cholestasis of pregnancy   ? UTI (urinary tract infection)   ? ? ?Past Surgical History:  ?Procedure Laterality Date  ? NO PAST SURGERIES    ? ? ?Family History  ?Problem Relation Age of Onset  ? Depression Mother   ?  Cancer Mother   ?     breast  ? High Cholesterol Mother   ? Thyroid disease Mother   ? Bipolar disorder Mother   ? Hyperlipidemia Father   ? Hypertension Father   ? Diabetes Paternal Aunt   ? Colon cancer Paternal Uncle   ? ? ?Social History  ? ?Tobacco Use  ? Smoking status: Never  ? Smokeless tobacco: Never  ?Vaping Use  ? Vaping Use: Never used  ?Substance Use Topics  ? Alcohol use: Not Currently  ? Drug use: Not Currently  ?  Types: Marijuana  ?  Comment: last time smoked 12/25/2020  ? ? ?Allergies:  ?Allergies  ?Allergen Reactions  ? Eggs Or Egg-Derived Products Rash  ?  Other reaction(s): Unknown  ? Morphine And Related   ?  Doesn't remember. ?Thinks she had itching and maybe her lips swelled ?  ? ? ?Medications Prior to Admission  ?Medication Sig Dispense Refill Last Dose  ? MAKENA autoinjector Inject into the skin.   06/11/2021  ? Prenatal Vit-Fe Fumarate-FA (PREPLUS) 27-1 MG TABS Take 1 tablet by mouth daily. 30 tablet  12 06/11/2021  ? ? ?Review of Systems  ?Genitourinary:  Positive for vaginal discharge.  ?Musculoskeletal:  Positive for back pain.  ?All other systems reviewed and are negative. ?Physical Exam  ? ?Blood pressure 109/66, pulse 94, temperature 98.1 ?F (36.7 ?C), resp. rate 20, last menstrual period 12/16/2020, SpO2 100 %. ? ?Physical Exam ?Vitals and nursing note reviewed. Exam conducted with a chaperone present.  ?Constitutional:   ?   Appearance: Normal appearance. She is not ill-appearing.  ?Cardiovascular:  ?   Rate and Rhythm: Normal rate and regular rhythm.  ?   Pulses: Normal pulses.  ?   Heart sounds: Normal heart sounds.  ?Pulmonary:  ?   Effort: Pulmonary effort is normal.  ?   Breath sounds: Normal breath sounds.  ?Abdominal:  ?   Comments: Gravid  ?Genitourinary: ?   Comments: Pelvic exam: External genitalia normal, vaginal walls pink and well rugated, cervix visually closed, no lesions noted. White discharge visible throughout vaginal vault. Cervix visually closed. ? ? ?Neurological:   ?   Mental Status: She is alert.  ? ? ?MAU Course  ?Procedures: sterile speculum exam ? ?MDM ? ?--EMR reviewed. Hx SVD at 73 (2014) and 35 weeks (2017). On weekly 17-P ?--Cervicovaginal swab collective in office 24 hours ago. No indication for stat wet prep. Will not recollect in MAU ?--Cervix 1cm, fern negative per Dr. Kennon Rounds in office yesterday 06/11/2021 ?--Cervix remains 1cm/thick/very posterior on initial exam ?--Reactive tracing: baseline 155, mod var, + 10 x 10 accels, no decels ?--Toco: Uterine irritability ?--Intact amniotic sac: negative pooling, negative fern, negative amnisure ?--1143: CNM returned to bedside. Patient sleeping. Declines recheck of cervix ? ?Patient Vitals for the past 24 hrs: ? BP Temp Temp src Pulse Resp SpO2  ?06/12/21 1205 (!) 87/48 98.5 ?F (36.9 ?C) Oral 87 18 100 %  ?06/12/21 0915 -- -- -- -- -- 100 %  ?06/12/21 0908 109/66 -- -- 94 20 100 %  ?06/12/21 0852 110/66 98.1 ?F (36.7 ?C) -- 98 18 --  ? ?Results for orders placed or performed during the hospital encounter of 06/12/21 (from the past 24 hour(s))  ?Urinalysis, Routine w reflex microscopic Urine, Clean Catch     Status: Abnormal  ? Collection Time: 06/12/21  9:02 AM  ?Result Value Ref Range  ? Color, Urine YELLOW YELLOW  ? APPearance CLOUDY (A) CLEAR  ? Specific Gravity, Urine 1.018 1.005 - 1.030  ? pH 7.0 5.0 - 8.0  ? Glucose, UA NEGATIVE NEGATIVE mg/dL  ? Hgb urine dipstick NEGATIVE NEGATIVE  ? Bilirubin Urine NEGATIVE NEGATIVE  ? Ketones, ur NEGATIVE NEGATIVE mg/dL  ? Protein, ur NEGATIVE NEGATIVE mg/dL  ? Nitrite NEGATIVE NEGATIVE  ? Leukocytes,Ua LARGE (A) NEGATIVE  ? RBC / HPF 0-5 0 - 5 RBC/hpf  ? WBC, UA 11-20 0 - 5 WBC/hpf  ? Bacteria, UA RARE (A) NONE SEEN  ? Squamous Epithelial / LPF 21-50 0 - 5  ? Mucus PRESENT   ?Amnisure rupture of membrane (rom)not at [facility]     Status: None  ? Collection Time: 06/12/21  9:31 AM  ?Result Value Ref Range  ? Amnisure ROM NEGATIVE   ?CBC     Status: Abnormal  ? Collection Time:  06/12/21  9:53 AM  ?Result Value Ref Range  ? WBC 6.6 4.0 - 10.5 K/uL  ? RBC 3.12 (L) 3.87 - 5.11 MIL/uL  ? Hemoglobin 10.4 (L) 12.0 - 15.0 g/dL  ? HCT 30.5 (L) 36.0 - 46.0 %  ?  MCV 97.8 80.0 - 100.0 fL  ? MCH 33.3 26.0 - 34.0 pg  ? MCHC 34.1 30.0 - 36.0 g/dL  ? RDW 12.5 11.5 - 15.5 %  ? Platelets 210 150 - 400 K/uL  ? nRBC 0.0 0.0 - 0.2 %  ?Comprehensive metabolic panel     Status: Abnormal  ? Collection Time: 06/12/21  9:53 AM  ?Result Value Ref Range  ? Sodium 134 (L) 135 - 145 mmol/L  ? Potassium 3.9 3.5 - 5.1 mmol/L  ? Chloride 105 98 - 111 mmol/L  ? CO2 23 22 - 32 mmol/L  ? Glucose, Bld 76 70 - 99 mg/dL  ? BUN 8 6 - 20 mg/dL  ? Creatinine, Ser 0.52 0.44 - 1.00 mg/dL  ? Calcium 8.8 (L) 8.9 - 10.3 mg/dL  ? Total Protein 6.4 (L) 6.5 - 8.1 g/dL  ? Albumin 3.1 (L) 3.5 - 5.0 g/dL  ? AST 22 15 - 41 U/L  ? ALT 23 0 - 44 U/L  ? Alkaline Phosphatase 91 38 - 126 U/L  ? Total Bilirubin 0.3 0.3 - 1.2 mg/dL  ? GFR, Estimated >60 >60 mL/min  ? Anion gap 6 5 - 15  ? ?Meds ordered this encounter  ?Medications  ? acetaminophen (TYLENOL) tablet 650 mg  ? cyclobenzaprine (FLEXERIL) tablet 10 mg  ? lactated ringers bolus 1,000 mL  ? cyclobenzaprine (FLEXERIL) 10 MG tablet  ?  Sig: Take 1 tablet (10 mg total) by mouth 2 (two) times daily as needed for muscle spasms.  ?  Dispense:  20 tablet  ?  Refill:  0  ?  Order Specific Question:   Supervising Provider  ?  AnswerJanyth Pupa [id number]  ? ?Assessment and Plan  ?--31 y.o. [id number] at [redacted]w[redacted]d  ?--Reactive tracing ?--Intact amniotic sac ?--Cervix 1cm/thick/very posterior, unchanged from previous ?--Patient sleeping after Flexeril, fluid bolus, Tylenol ?--Patient declines recheck of cervix prior to discharge ?--Discharge home in stable condition ? ?Darlina Rumpf, CNM ?06/12/2021, 1:34 PM  ?

## 2021-06-14 ENCOUNTER — Encounter: Payer: Self-pay | Admitting: Family Medicine

## 2021-06-14 LAB — CULTURE, OB URINE: Culture: >=100000 — AB

## 2021-06-15 ENCOUNTER — Encounter: Payer: Self-pay | Admitting: *Deleted

## 2021-06-15 ENCOUNTER — Other Ambulatory Visit: Payer: Self-pay | Admitting: Family Medicine

## 2021-06-15 ENCOUNTER — Encounter: Payer: Self-pay | Admitting: Family Medicine

## 2021-06-15 MED ORDER — CEFADROXIL 500 MG PO CAPS: 500.0000 mg | ORAL_CAPSULE | Freq: Two times a day (BID) | ORAL | 0 refills | Status: DC

## 2021-06-19 ENCOUNTER — Ambulatory Visit: Payer: Medicaid Other

## 2021-06-19 ENCOUNTER — Other Ambulatory Visit: Payer: Self-pay

## 2021-06-19 ENCOUNTER — Ambulatory Visit (INDEPENDENT_AMBULATORY_CARE_PROVIDER_SITE_OTHER): Payer: Medicaid Other | Admitting: Medical

## 2021-06-19 ENCOUNTER — Encounter: Payer: Self-pay | Admitting: Medical

## 2021-06-19 VITALS — BP 100/63 | HR 100 | Wt 156.0 lb

## 2021-06-19 DIAGNOSIS — O2342 Unspecified infection of urinary tract in pregnancy, second trimester: Secondary | ICD-10-CM

## 2021-06-19 DIAGNOSIS — Z23 Encounter for immunization: Secondary | ICD-10-CM | POA: Diagnosis not present

## 2021-06-19 DIAGNOSIS — Z8759 Personal history of other complications of pregnancy, childbirth and the puerperium: Secondary | ICD-10-CM

## 2021-06-19 DIAGNOSIS — O09899 Supervision of other high risk pregnancies, unspecified trimester: Secondary | ICD-10-CM

## 2021-06-19 DIAGNOSIS — Z8719 Personal history of other diseases of the digestive system: Secondary | ICD-10-CM

## 2021-06-19 DIAGNOSIS — Z3480 Encounter for supervision of other normal pregnancy, unspecified trimester: Secondary | ICD-10-CM

## 2021-06-19 DIAGNOSIS — N949 Unspecified condition associated with female genital organs and menstrual cycle: Secondary | ICD-10-CM

## 2021-06-19 DIAGNOSIS — Z3A26 26 weeks gestation of pregnancy: Secondary | ICD-10-CM

## 2021-06-19 MED ORDER — HYDROXYPROGESTERONE CAPROATE 275 MG/1.1ML ~~LOC~~ SOAJ
275.0000 mg | Freq: Once | SUBCUTANEOUS | Status: AC
  Administered 2021-06-19: 275 mg via SUBCUTANEOUS

## 2021-06-19 MED ORDER — COMFORT FIT MATERNITY SUPP MED MISC: 1.0000 [IU] | Freq: Every day | 0 refills | Status: DC

## 2021-06-19 NOTE — Patient Instructions (Signed)
AREA PEDIATRIC/FAMILY PRACTICE PHYSICIANS ° °[place] ([address]) °[facility] °Chambliss, MD; Eniola, MD; Hale, MD; Hensel, MD; McDiarmid, MD; McIntyer, MD; Neal, MD; Walden, MD °[address] [place], [place] [address] °[phone] °Mon-Fri 8:30-12:30, 1:30-5:00 °Providers come to see babies at [facility] °Accepting Medicaid °[facility] °Limited providers who accept newborns: Koirala, MD; Morrow, MD; Wolters, MD °[address], [place], [place] [address] °[phone] °Mon-Fri 8:00-5:30 °Babies seen by providers at [facility] °Does NOT accept Medicaid °Please call early in hospitalization for appointment (limited availability)  °[facility] °Mulberry, MD °[address] [place], [place] [address] °[phone] °Mon, Tue, Thur, Fri 8:30-5:00, Wed 10:00-7:00 (closed 1-2pm) °Babies seen by [facility] providers °Accepting Medicaid °[facility] °Rubin, MD °[address], [place], [place] [address] °[phone] °Mon-Fri 8:30-5:00, Sat 8:30-12:00 °Provider comes to see babies at [facility] °Accepting Medicaid °Must have been referred from current patients or contacted office prior to delivery °[facility] ([facility]) °Brown, MD; Chandler, MD; Ettefagh, MD; Grant, MD; Lester, MD; McCormick, MD; McQueen, MD; Prose, MD; Simha, MD; Stanley, MD; Stryffeler, NP; Tebben, NP °[address], [place], [place] [address] °[phone] °Mon, Tue, Thur, Fri 8:30-5:30, Wed 9:30-5:30, Sat 8:30-12:30 °Babies seen by [facility] providers °Accepting Medicaid °Only accepting infants of first-time parents or siblings of current patients °Hospital discharge coordinator will make follow-up appointment °Jack Amos °[address], [place], [place]  [address] °[phone]   Fax - [phone] °[facility] °[address], [place], [place]  [address] °Phone - [phone]   Fax - [phone] °Shilpa Gosrani °[address], [place], [place]  [address] °[phone] ° °[place] ([address]) °[facility] °Bates, MD; Brassfield, MD; Cooper, Cox, MD; MD; Davis, MD; Dovico, MD; Ettefaugh, MD; Little, MD; Lowe, MD; Keiffer, MD; Melvin, MD; Sumner, MD; Williams, MD °[address], [place], [place] [address] °[phone] °Mon-Fri 8:30-5:00 (extended evenings Mon-Thur as needed), Sat-Sun 10:00-1:00 °Providers come to see babies at [facility] °Accepting Medicaid for families of first-time babies and families with all children in the household age 3 and under. Must register with office prior to making appointment (M-F only). °[facility] °Henson, NP; Knapp, MD; Lalonde, MD; Tysinger, PA °[address] [place], [place] [address] °[phone] °Mon-Fri 8:00-5:00 °Babies seen by providers at [facility] °Does NOT accept Medicaid/Commercial Insurance Only °[facility] ([facility]  °Sather, MD; Barnes, MD; Bratton, MD; Coccaro, MD; Lockett Gardner, MD; Kramer, MD; Marshall, MD; Netherton, MD; Poleto, MD; Skinner, MD °[address] [place], [place] [address] °[phone] °Mon-Fri 8:30-5:30, Sat (Oct.-Mar.) 9:00-1:00 °Babies seen by providers at [facility] °Accepting Medicaid ° °[place] ([address]) °[facility] °Reid, MD; Warner, MD °[address], [place], [place] [address] °[phone] °Mon-Fri 8:30-5:00, Sat 8:30-12:00 °Providers come to see babies at [facility] °Does NOT accept Medicaid °[facility] °Becker, PA; Hagler, MD; Scifres, PA; Sun, MD; Swayne, MD °[address], [place], [place] [address] °[phone] °Mon-Fri 8:00-5:00 °Babies seen by providers at [facility] °Does NOT accept Medicaid °Only accepting babies of parents who  are patients °Please call early in hospitalization for appointment (limited availability) ° Pediatricians °Clark, MD; Frye, MD; Kelleher, MD; Mack, NP; Miller, MD; O'Keller, MD; Patterson, NP; Pudlo, MD; Puzio, MD; Thomas, MD; Tucker, MD; Twiselton, MD °[address], [place], [place] [address] °[phone] °Mon-Fri 8:00-5:00, Sat 9:00-12:00 °Providers come to see babies at [facility] °Does NOT accept Medicaid ° °[place] ([address]) °[facility] °Limited providers accepting new patients: Brake, NP; Wharton, PA °[address], [place], [place] [address] °[phone] °Mon-Fri 8:00-5:00 °Babies seen by providers at [facility] °Does NOT accept Medicaid °Only accepting babies of parents who are patients °Please call early in hospitalization for appointment (limited availability) °[facility] °Gay, MD; Quinlan, MD °[address] [place], [place] [address] °[phone] (press 1 to schedule appointment) °Mon-Fri 8:00-5:00 °Providers come to see babies at [facility] °Does NOT accept Medicaid °[facility] °Mazer, MD °[address] [place], [place] [address] °[phone] °Mon-Fri 8:30-5:00 (lunch 12:30-1:00), extended hours by appointment only Wed 5:00-6:30 °Babies seen by [facility] providers °Accepting Medicaid °[facility] °Banks, MD; Jeweliana, MD; Koberlein, MD °[address], [place], [place] [address] °[phone] °Mon-Fri 8:00-5:00 °Babies seen by [facility] providers °Does NOT accept Medicaid °[facility] °Parker, MD; Hunter, MD; Wallace, DO °[address] [place], [place] [address] °[phone] °Mon-Fri 8:00-5:00 °Babies seen by [facility] providers °Does NOT accept Medicaid °[facility] °Brandon, PA; Brecken, PA; Christy, NP; Dees, MD; DeClaire, MD; DeWeese, MD; Hansen, NP; Mills, NP; Parrish, NP; Smoot, NP; Summer, MD; Vapne,  MD °[address] [place], [place] [address] °[phone] °Mon-Fri 8:30-5:00, Sat 10:00-1:00 °Providers come to see babies at [facility] °Does NOT accept Medicaid °Free prenatal information session Tuesdays at 4:45pm °[facility] °Bouska, MD; Gordon, PA; Jeffery, PA; Weber, PA °[address] [place] [address] °[phone] °Mon-Fri 7:30-5:30 °Babies seen by [facility] providers °[facility] °[address], [place], [place]  [address] °[phone]   Fax - [phone] ° °[place] ([address]) °[facility] °Reese, MD °[address] [place], [place] [address] °[phone] °Mon-Thur 8:00-6:00 °Providers come to see babies at [facility] °Accepting Medicaid °[facility] °Anderson, NP; Badger, MD; Beal, PA; Spencer, PA °[address] [place], [place] [address] °[phone] °Mon-Thur 7:30-7:30, Fri 7:30-4:30 °Babies seen by [facility] providers °Accepting Medicaid °[facility] °Agbuya, MD; Klett, NP; Romgoolam, MD °[address], [place], [place] [address] °[phone] °Mon-Fri 8:30-5:00, Sat 8:30-12:00 °Providers come to see babies at [facility] °Accepting Medicaid °Must have “Meet & Greet” appointment at office prior to delivery °[facility] ([facility]) °McCord, MD; Wallace, MD; Wood, MD °[address], [place], [place] [address] °[phone] °Mon-Wed 8:00-6:00, Thur-Fri 8:00-5:00, Sat 9:00-12:00 °Providers come to see babies at [facility] °Does NOT accept Medicaid °Only accepting siblings of current patients °[facility]  °[address], [place], [place]  [address] °[phone]   Fax - [phone] °[facility] °[address], [place], [place]  [address] °[phone]   Fax -  [phone] ° °[place] ([address]) °[facility] °Cirigliano, DO; Matthews, DO °[address] [place], [place] [address] °[phone] °Mon-Fri 7:00-5:00 °Babies seen by [facility] providers °Does NOT accept Medicaid °[facility] °Briscoe, MD; Howley, PA; Moreira, PA °[address], [place], [place] [address] °[phone] °Mon-Fri 8:00-5:00 °Babies seen by [facility] providers °Accepting Medicaid °[facility] °Boyd, MD; Church, PA; Jones, NP; Osborn, PA °[address], [place],  [address] °[phone] °Mon-Fri 8:00-5:00 °Babies seen by providers at [facility] °Accepting Medicaid ° °[place] ([address]) °[facility] [place] °Wendling, DO °[address] [place], [place] [address] °[phone] °Mon-Fri 8:00-5:00 °Babies seen by [facility] providers °Does NOT accept Medicaid °Limited availability, please call early in hospitalization to schedule follow-up °[facility] °Calderon, PA; Cummings, MD; Dillard, MD; Martin, PA; Olson, MD; VanDeven, PA °[address], [place], [place] [address] °[phone] °Mon-Fri 8:30-5:00, Sat 9:00-12:00 °Babies seen by providers at [facility] °Accepting Medicaid °Please register online then schedule online or call office °[email or web address] °[facility] ([facility] °Hunter, NP; Kumar, MD; Martin Rogers, PA °[address], [place], [place] [address] °[phone] °Mon-Fri 8:00-5:00 °Babies seen by providers at [facility] °Accepting Medicaid °[facility] ([facility]) °Kamiah, MD; Kristi Fleenor, NP; West, MD °[address], [place], [place] [address] °[phone] °Mon-Fri 8:00-5:30, Sat&Sun by appointment (phones open at  8:30) °Babies seen by [facility] providers °Accepting Medicaid °Must be a first-time baby or sibling of current patient °[facility] [place]  °[address], [place], [place]  [address] °[phone]   Fax - [phone] ° °[place] ([address]) °[facility] °Brown, PA; Cowen, PA; Rice, MD; Helton, PA; Spry, MD °[address] [place], [place] [address] °[phone] °Mon-Thur 8:00-7:00, Fri 8:00-5:00, Sat 8:00-12:00, Sun 9:00-12:00 °Babies seen by [facility] providers °Accepting Medicaid °[facility] [place] °Coe-Goins, MD; Marshall, MD; Pierre-Louis, MD °[address] [place] [address], [place], [place] [address] °[phone] °Mon-Thur 8:00-5:00 °Babies seen by providers at [facility] °Accepting Medicaid °[facility] °Bratton, MD; Coe-Goins, MD; Hayes, MD; Lewis, MD; List, MD; Lott, MD; Marshall, MD; Moran, MD; O'Neal, MD; Pierre-Louis, MD; Pitonzo, MD; Scholer, MD; Spangle, MD °[address] [place], [place] [address] °[phone] °Mon-Fri 8:00-5:30, Sat (Oct.-Mar.) 9:00-1:00 °Babies seen by providers at [facility] °Accepting Medicaid °Must fill out new patient packet, available online at [email or web address] °[facility] ([facility]) °Friddle, NP; Harris, NP; Kelly, NP; Logan, MD; Melvin, PA; Poth, MD; Ramadoss, MD; Stanton, NP °[address], [place], [place] [address] °[phone] °Mon-Thur 8:00-5:30, Fri 8:00-5:00 °Babies seen by providers at [facility] °Accepting Medicaid ° °[place] ([address]) °[facility] °Dixon, PA; Spaulding, MD; Pickard, MD; Tapia, PA °[address], [place], [place] [address] °[phone] °Mon-Fri 8:00-5:00 °Babies seen by providers at [facility] °Accepting Medicaid  ° °[place] ([address]) °[facility] [place] °Masneri, DO; Meyers, MD; [place], PA °[address], [place], [place] [address] °[phone] °Mon-Fri 8:00-5:00 °Babies seen by providers at [facility] °Does NOT accept Medicaid °Limited appointment availability, please call early in hospitalization ° °[facility] [place] °Kunedd, DO; McGowen, MD °[address], [place], [place] [address] °[phone] °Mon-Fri 8:00-5:00 °Babies seen by [facility] providers °Does NOT accept Medicaid °[facility] [place] °Cameron, MD; MacDonald, MD; Michaels, PA; Nayak, MD °[address] [place] [address], [place],  [address] °[phone] °Mon-Fri 8:00-5:00 °After hours clinic ([address] [place],  [address]) [phone] Mon-Fri 5:00-8:00, Sat 12:00-6:00, Sun 10:00-4:00 °Babies seen by [facility] providers °Accepting Medicaid °[facility] [place] °[address], [place], [place]  [address] °[phone]   Fax - [phone] ° °[place] ([address]) °[facility] [place] [facility] °Andy, MD °[address], [place], [place] [address] °[phone] °Mon-Fri 8:00-5:00 °Babies seen by [facility] providers °Does NOT accept Medicaid °[facility] [place] ([facility] [place]) °Eksir, MD °[address], [place], [place] [address] °[phone] °Mon-Thur 8:00-7:00, Fri 8:00-5:00, Sat 8:00-12:00 °Babies seen by providers at [facility] °Accepting Medicaid - but does not have vaccinations in office (must be received elsewhere) °Limited availability, please call early in hospitalization ° °[place] ([address]) °[facility]  °Charlene Flemming, MD °[address],  [place] [address] °[phone]  Fax [phone] ° °[place] °[facility]  °[facility]  °Kimberly Newton, MD, Annamarie Streilein, PA, Carla Hampton, PA °[address] [place] °[place], [place]  [address] °[phone] °[facility]  °[address], [place], [place] [address] °[phone] °[address], [place], [place] [address] °[phone] (West Office)  °[facility] °[address], [place], [place] [address] °[phone] °[facility] °[address], [place], [place] [address] °[phone] °[facility] °[address], [place], [place] [address] °[phone] °[facility] °[address], [place], [place] [address] °[phone] °[facility] °[address], [place], [place] [address] °[phone] °[facility] °[address], [place], [place] [address] °[phone] °[facility]  °[address], [place], [place] [address] °[phone] °Dr. Robert W. Little °[address], [place], [place] [address] °[phone] °[facility] °[address], [place], [place] [address] °[phone] °[facility] °[address], , [place] [address] °[phone] ° °

## 2021-06-19 NOTE — Progress Notes (Signed)
2 ? ?PRENATAL VISIT NOTE ? ?Subjective:  ?Gloria Mann is a 31 y.o. [phone] at [redacted]w[redacted]d being seen today for ongoing prenatal care.  She is currently monitored for the following issues for this low-risk pregnancy and has Supervision of other normal pregnancy, antepartum; History of preterm delivery, currently pregnant; History of cholestasis during pregnancy; Chest wall pain; and Pregnancy related fatigue in second trimester on their problem list. ? ?Patient reports intermittent round ligament pain .  Contractions: Not present. Vag. Bleeding: None.  Movement: Present. Denies leaking of fluid.  ? ?The following portions of the patient's history were reviewed and updated as appropriate: allergies, current medications, past family history, past medical history, past social history, past surgical history and problem list.  ? ?Objective:  ? ?Vitals:  ? 06/19/21 1011  ?BP: 100/63  ?Pulse: 100  ?Weight: 156 lb (70.8 kg)  ? ? ?Fetal Status: Fetal Heart Rate (bpm): 154 Fundal Height: 30 cm Movement: Present    ? ?General:  Alert, oriented and cooperative. Patient is in no acute distress.  ?Skin: Skin is warm and dry. No rash noted.   ?Cardiovascular: Normal heart rate noted  ?Respiratory: Normal respiratory effort, no problems with respiration noted  ?Abdomen: Soft, gravid, appropriate for gestational age.  Pain/Pressure: Present     ?Pelvic: Cervical exam deferred        ?Extremities: Normal range of motion.  Edema: None  ?Mental Status: Normal mood and affect. Normal behavior. Normal judgment and thought content.  ? ?Assessment and Plan:  ?Pregnancy: [id number] at [redacted]w[redacted]d ?1. Supervision of other normal pregnancy, antepartum ?- Doing well ?- Discussed peds, list given  ?- Anticipatory guidance for fasting GTT, CBC, HIV and RPR discussed for next visit  ?- Tdap vaccine greater than or equal to 7yo IM ? ?2. History of preterm delivery, currently pregnant ?- HYDROXYprogesterone caproate (Makena) autoinjector 275 mg ?- Follow-up US  scheduled 06/24/21 ? ?3. History of cholestasis during pregnancy ?- Normal bile acids in early pregnancy ?- Currently asymptomatic  ?- Advised we will re-check bile acids at 36 weeks or sooner if symptoms develop  ? ?4. UTI in pregnancy, antepartum, second trimester ?- Taking Duricef, advised to completed full course and report changes in symptoms  ? ?5. [redacted] weeks gestation of pregnancy ? ?6. Round ligament pain ?- Elastic Bandages & Supports (COMFORT FIT MATERNITY SUPP MED) MISC; 1 Units by Does not apply route daily.  Dispense: 1 each; Refill: 0 ? ?Preterm labor symptoms and general obstetric precautions including but not limited to vaginal bleeding, contractions, leaking of fluid and fetal movement were reviewed in detail with the patient. ?Please refer to After Visit Summary for other counseling recommendations.  ? ?Return in about 2 weeks (around 07/03/2021) for [facility] APP, In-Person, 28 week labs (fasting). ? ?Future Appointments  ?Date Time Provider [place]  ?06/24/2021  8:30 AM [facility] NURSE [facility]  ?06/24/2021  8:45 AM [facility] US4 [facility]  ?06/24/2021  1:40 PM Tobb, Godfrey Pick, DO [facility]  ?06/26/2021  9:00 AM [facility] NURSE [facility]  ?07/03/2021  8:35 AM Constant, Vickii Chafe, MD [facility]  ?07/03/2021  9:30 AM [facility] LAB [facility]  ? ? ?Kerry Hough, PA-C ? ?

## 2021-06-19 NOTE — Progress Notes (Signed)
Patient reports pain on right side of groin but otherwise is feeling fine. ? ? ?TDAP vaccine administered into left deltoid without any complications. ? ?Makena (17p) 275 mg administered (subcutaneously) into right deltoid without any complications.  ? ?Ms. Kreh had no questions or concerns today. ? ?Kaenan Jake, CMA  ? ?06/19/21 ?

## 2021-06-24 ENCOUNTER — Ambulatory Visit: Payer: Medicaid Other | Attending: Obstetrics

## 2021-06-24 ENCOUNTER — Other Ambulatory Visit: Payer: Self-pay

## 2021-06-24 ENCOUNTER — Ambulatory Visit: Payer: Medicaid Other | Admitting: *Deleted

## 2021-06-24 ENCOUNTER — Encounter: Payer: Self-pay | Admitting: *Deleted

## 2021-06-24 ENCOUNTER — Ambulatory Visit (INDEPENDENT_AMBULATORY_CARE_PROVIDER_SITE_OTHER): Payer: Medicaid Other | Admitting: General Practice

## 2021-06-24 ENCOUNTER — Ambulatory Visit: Payer: Medicaid Other | Admitting: Cardiology

## 2021-06-24 VITALS — BP 101/56 | HR 88

## 2021-06-24 VITALS — BP 101/61 | HR 86 | Ht 63.5 in | Wt 154.0 lb

## 2021-06-24 DIAGNOSIS — Z3A27 27 weeks gestation of pregnancy: Secondary | ICD-10-CM | POA: Insufficient documentation

## 2021-06-24 DIAGNOSIS — O09899 Supervision of other high risk pregnancies, unspecified trimester: Secondary | ICD-10-CM | POA: Insufficient documentation

## 2021-06-24 DIAGNOSIS — O26612 Liver and biliary tract disorders in pregnancy, second trimester: Secondary | ICD-10-CM | POA: Insufficient documentation

## 2021-06-24 DIAGNOSIS — Z8719 Personal history of other diseases of the digestive system: Secondary | ICD-10-CM | POA: Insufficient documentation

## 2021-06-24 DIAGNOSIS — O09212 Supervision of pregnancy with history of pre-term labor, second trimester: Secondary | ICD-10-CM | POA: Diagnosis not present

## 2021-06-24 DIAGNOSIS — K831 Obstruction of bile duct: Secondary | ICD-10-CM | POA: Diagnosis not present

## 2021-06-24 DIAGNOSIS — Z362 Encounter for other antenatal screening follow-up: Secondary | ICD-10-CM

## 2021-06-24 DIAGNOSIS — Z8759 Personal history of other complications of pregnancy, childbirth and the puerperium: Secondary | ICD-10-CM | POA: Insufficient documentation

## 2021-06-24 DIAGNOSIS — Z3480 Encounter for supervision of other normal pregnancy, unspecified trimester: Secondary | ICD-10-CM | POA: Insufficient documentation

## 2021-06-24 MED ORDER — HYDROXYPROGESTERONE CAPROATE 275 MG/1.1ML ~~LOC~~ SOAJ
275.0000 mg | Freq: Once | SUBCUTANEOUS | Status: AC
  Administered 2021-06-24: 275 mg via SUBCUTANEOUS

## 2021-06-24 NOTE — Progress Notes (Signed)
Gloria Mann here for 17-P  Injection.  Injection administered without complication. Patient will return in one week for next injection. ? ?[name], RN ?06/24/2021  11:07 AM ? ? ?  ?

## 2021-06-25 ENCOUNTER — Encounter: Payer: Medicaid Other | Admitting: Obstetrics and Gynecology

## 2021-06-26 ENCOUNTER — Ambulatory Visit: Payer: Medicaid Other

## 2021-07-03 ENCOUNTER — Other Ambulatory Visit: Payer: Self-pay

## 2021-07-03 ENCOUNTER — Other Ambulatory Visit: Payer: Medicaid Other

## 2021-07-03 ENCOUNTER — Ambulatory Visit (INDEPENDENT_AMBULATORY_CARE_PROVIDER_SITE_OTHER): Payer: Medicaid Other | Admitting: Obstetrics and Gynecology

## 2021-07-03 ENCOUNTER — Encounter: Payer: Self-pay | Admitting: Obstetrics and Gynecology

## 2021-07-03 VITALS — BP 119/65 | Wt 156.0 lb

## 2021-07-03 DIAGNOSIS — Z3A26 26 weeks gestation of pregnancy: Secondary | ICD-10-CM

## 2021-07-03 DIAGNOSIS — Z8759 Personal history of other complications of pregnancy, childbirth and the puerperium: Secondary | ICD-10-CM

## 2021-07-03 DIAGNOSIS — O09899 Supervision of other high risk pregnancies, unspecified trimester: Secondary | ICD-10-CM

## 2021-07-03 DIAGNOSIS — Z3480 Encounter for supervision of other normal pregnancy, unspecified trimester: Secondary | ICD-10-CM

## 2021-07-03 DIAGNOSIS — Z8719 Personal history of other diseases of the digestive system: Secondary | ICD-10-CM

## 2021-07-03 MED ORDER — HYDROXYPROGESTERONE CAPROATE 275 MG/1.1ML ~~LOC~~ SOAJ
275.0000 mg | Freq: Once | SUBCUTANEOUS | Status: AC
  Administered 2021-07-03: 275 mg via SUBCUTANEOUS

## 2021-07-03 NOTE — Progress Notes (Signed)
? ?  PRENATAL VISIT NOTE ? ?Subjective:  ?Gloria Mann is a 31 y.o. [phone] at [redacted]w[redacted]d being seen today for ongoing prenatal care.  She is currently monitored for the following issues for this low-risk pregnancy and has Supervision of other normal pregnancy, antepartum; History of preterm delivery, currently pregnant; History of cholestasis during pregnancy; Chest wall pain; and Pregnancy related fatigue in second trimester on their problem list. ? ?Patient reports no complaints.  Contractions: Not present. Vag. Bleeding: None.  Movement: Present. Denies leaking of fluid.  ? ?The following portions of the patient's history were reviewed and updated as appropriate: allergies, current medications, past family history, past medical history, past social history, past surgical history and problem list.  ? ?Objective:  ? ?Vitals:  ? 07/03/21 0843  ?BP: 119/65  ?Weight: 156 lb (70.8 kg)  ? ? ?Fetal Status: Fetal Heart Rate (bpm): 150 Fundal Height: 29 cm Movement: Present    ? ?General:  Alert, oriented and cooperative. Patient is in no acute distress.  ?Skin: Skin is warm and dry. No rash noted.   ?Cardiovascular: Normal heart rate noted  ?Respiratory: Normal respiratory effort, no problems with respiration noted  ?Abdomen: Soft, gravid, appropriate for gestational age.  Pain/Pressure: Absent     ?Pelvic: Cervical exam deferred        ?Extremities: Normal range of motion.  Edema: None  ?Mental Status: Normal mood and affect. Normal behavior. Normal judgment and thought content.  ? ?Assessment and Plan:  ?Pregnancy: [id number] at [redacted]w[redacted]d ?1. Supervision of other normal pregnancy, antepartum ?Patient is doing well without complaints ?Third trimester labs and glucola today ?Patient is considering BTL- form signed today ?Peds list provided ? ?2. History of preterm delivery, currently pregnant ?Continue weekly progesterone ? ?3. History of cholestasis during pregnancy ?Asymptomatic ? ?Preterm labor symptoms and general obstetric  precautions including but not limited to vaginal bleeding, contractions, leaking of fluid and fetal movement were reviewed in detail with the patient. ?Please refer to After Visit Summary for other counseling recommendations.  ? ?Return in about 2 weeks (around 07/17/2021) for in person, ROB, High risk. ? ?Future Appointments  ?Date Time Provider Department Center  ?07/03/2021  9:30 AM [facility] LAB [facility]  ? ? ?Catalina Antigua, MD ? ?

## 2021-07-03 NOTE — Progress Notes (Signed)
28 week labs today.no vb no lof. ?

## 2021-07-03 NOTE — Addendum Note (Signed)
Addended by: Brien Few on: 07/03/2021 09:13 AM ? ? Modules accepted: Orders ? ?

## 2021-07-03 NOTE — Patient Instructions (Signed)
AREA PEDIATRIC/FAMILY PRACTICE PHYSICIANS ? ?[place] ([address]) ?[facility] ?Chambliss, MD; Eniola, MD; Hale, MD; Hensel, MD; McDiarmid, MD; McIntyer, MD; Neal, MD; Walden, MD ?[address] [place], [place] [address] ?[phone] ?Mon-Fri 8:30-12:30, 1:30-5:00 ?Providers come to see babies at [facility] ?Accepting Medicaid ?[facility] ?Limited providers who accept newborns: Koirala, MD; Morrow, MD; Wolters, MD ?[address], [place], [place] [address] ?[phone] ?Mon-Fri 8:00-5:30 ?Babies seen by providers at [facility] ?Does NOT accept Medicaid ?Please call early in hospitalization for appointment (limited availability)  ?[facility] ?Mulberry, MD ?[address] [place], [place] [address] ?[phone] ?Mon, Tue, Thur, Fri 8:30-5:00, Wed 10:00-7:00 (closed 1-2pm) ?Babies seen by [facility] providers ?Accepting Medicaid ?[facility] ?Rubin, MD ?[address], [place], [place] [address] ?[phone] ?Mon-Fri 8:30-5:00, Sat 8:30-12:00 ?Provider comes to see babies at [facility] ?Accepting Medicaid ?Must have been referred from current patients or contacted office prior to delivery ?[facility] ([facility]) ?Brown, MD; Chandler, MD; Ettefagh, MD; Grant, MD; Lester, MD; McCormick, MD; McQueen, MD; Prose, MD; Simha, MD; Stanley, MD; Stryffeler, NP; Tebben, NP ?[address], [place], [place] [address] ?[phone] ?Mon, Tue, Thur, Fri 8:30-5:30, Wed 9:30-5:30, Sat 8:30-12:30 ?Babies seen by [facility] providers ?Accepting Medicaid ?Only accepting infants of first-time parents or siblings of current patients ?Hospital discharge coordinator will make follow-up appointment ?Jack Amos ?[address], [place], [place]  [address] ?[phone]   Fax - [phone] ?[facility] ?[address], [place], [place]  [address] ?Phone - [phone]   Fax - [phone] ?Shilpa Gosrani ?[address], [place], [place]  [address] ?[phone] ? ?[place] ([address]) ? [facility] ?Bates, MD; [place]assfield, MD; Cooper, Cox, MD; MD; Davis, MD; Dovico, MD; Ettefaugh, MD; Little, MD; Lowe, MD; Keiffer, MD; Melvin, MD; Sumner, MD; Williams, MD ?[address], [place], [place] [address] ?[phone] ?Mon-Fri 8:30-5:00 (extended evenings Mon-Thur as needed), Sat-Sun 10:00-1:00 ?Providers come to see babies at [facility] ?Accepting Medicaid for families of first-time babies and families with all children in the household age 3 and under. Must register with office prior to making appointment (M-F only). ?[facility] ?Henson, NP; Knapp, MD; Lalonde, MD; Tysinger, PA ?[address] [place], [place] [address] ?[phone] ?Mon-Fri 8:00-5:00 ?Babies seen by providers at [facility] ?Does NOT accept Medicaid/Commercial Insurance Only ?[facility] ([facility])  ?Viveiros, MD; Barnes, MD; Bratton, MD; Coccaro, MD; Lockett Gardner, MD; Kramer, MD; Marshall, MD; Netherton, MD; Poleto, MD; Skinner, MD ?[address] [place], [place] [address] ?[phone] ?Mon-Fri 8:30-5:30, Sat (Oct.-Mar.) 9:00-1:00 ?Babies seen by providers at [facility] ?Accepting Medicaid ? ?[place] ([address]) ?[facility] ?Reid, MD; Warner, MD ?[address], [place], [place] [address] ?[phone] ?Mon-Fri 8:30-5:00, Sat 8:30-12:00 ?Providers come to see babies at [facility] ?Does NOT accept Medicaid ?[facility] ?Becker, PA; Hagler, MD; Scifres, PA; Sun, MD; Swayne, MD ?[address], [place], [place] [address] ?[phone] ?Mon-Fri 8:00-5:00 ?Babies seen by providers at [facility] ?Does NOT accept Medicaid ?Only accepting babies of parents who  are patients ?Please call early in hospitalization for appointment (limited availability) ?[facility] ?Clark, MD; Frye, MD; Kelleher, MD; Mack, NP; Miller, MD; O'Keller, MD; Patterson, NP; Pudlo, MD; Puzio, MD; Thomas, MD; Tucker, MD; Twiselton, MD ?[address], [place], [place] [address] ?[phone] ?Mon-Fri 8:00-5:00, Sat 9:00-12:00 ?Providers come to see babies at [facility] ?Does NOT accept Medicaid ? ?[place] ([address]) ?[facility] ?Limited providers accepting new patients: Brake, NP; Wharton, PA ?[address], [place], [place] [address] ?[phone] ?Mon-Fri 8:00-5:00 ?Babies seen by providers at [facility] ?Does NOT accept Medicaid ?Only accepting babies of parents who are patients ?Please call early in hospitalization for appointment (limited availability) ?[facility] ?Gay, MD; Quinlan, MD ?[address] [place], [place] [address] ?[phone] (press [phone] to schedule appointment) ?Mon-Fri 8:00-5:00 ?Providers come to see babies at [facility] ?Does NOT accept Medicaid ?[facility] ?Mazer, MD ?[address] [place], [place] [address] ?[phone] ?Mon-Fri 8:30-5:00 (lunch 12:30-1:00), extended hours by appointment only Wed 5:00-6:30 ?Babies seen by [facility] providers ?Accepting Medicaid ?[facility] ?Banks, MD; Yazmeen, MD; Koberlein, MD ?[address], [place], [place] [address] ?[phone] ?Mon-Fri 8:00-5:00 ?Babies seen by [facility] providers ?Does NOT accept Medicaid ?[facility] ?Parker, MD; Hunter, MD; Wallace, DO ?[address] [place], [place] [address] ?[phone] ?Mon-Fri 8:00-5:00 ?Babies seen by [facility] providers ?Does NOT accept Medicaid ?[facility] ?Brandon, PA; Brecken, PA; Christy, NP; Dees, MD; DeClaire, MD; DeWeese, MD; Hansen, NP; Mills, NP; Parrish, NP; Smoot, NP; Summer, MD; Vapne,  MD ?[address] [place], [place] [address] ?[phone] ?Mon-Fri 8:30-5:00, Sat 10:00-1:00 ?Providers come to see babies at [facility] ?Does NOT accept Medicaid ?Free prenatal information session Tuesdays at 4:45pm ?[facility] ?Bouska, MD; Gordon, PA; Jeffery, PA; Weber, PA ?[address] [place] [address] ?[phone] ?Mon-Fri 7:30-5:30 ?Babies seen by [facility] providers ?[facility] ?[address], [place], [place]  [address] ?[phone]   Fax - [phone] ? ?[place] ([address]) ?[facility] ?Reese, MD ?[address] [place], [place] [address] ?[phone] ?Mon-Thur 8:00-6:00 ?Providers come to see babies at [facility] ?Accepting Medicaid ?[facility] ?Anderson, NP; Badger, MD; Beal, PA; Spencer, PA ?[address] [place], [place] [address] ?[phone] ?Mon-Thur 7:30-7:30, Fri 7:30-4:30 ?Babies seen by [facility] providers ?Accepting Medicaid ?[facility] ?Agbuya, MD; Klett, NP; Romgoolam, MD ?[address], [place], [place] [address] ?[phone] ?Mon-Fri 8:30-5:00, Sat 8:30-12:00 ?Providers come to see babies at [facility] ?Accepting Medicaid ?Must have ?Meet & Greet? appointment at office prior to delivery ?[facility] ([facility]) ?McCord, MD; Wallace, MD; Wood, MD ?[address], [place], [place] [address] ?[phone] ?Mon-Wed 8:00-6:00, Thur-Fri 8:00-5:00, Sat 9:00-12:00 ?Providers come to see babies at [facility] ?Does NOT accept Medicaid ?Only accepting siblings of current patients ?[facility]  ?[address], [place], [place]  [address] ?[phone]   Fax - [phone] ?[facility] ?[address], [place], [place]  [address] ?[phone]   Fax -  [phone] ? ?[place] ([address]) ?[facility] ?Cirigliano, DO; Matthews, DO ?[address] [place], [place] [address] ?[phone] ?Mon-Fri 7:00-5:00 ?Babies seen by [place]

## 2021-07-04 LAB — RPR: RPR Ser Ql: NONREACTIVE

## 2021-07-04 LAB — GLUCOSE TOLERANCE, 2 HOURS W/ 1HR
Glucose, 1 hour: 138 mg/dL (ref 70–179)
Glucose, 2 hour: 105 mg/dL (ref 70–152)
Glucose, Fasting: 75 mg/dL (ref 70–91)

## 2021-07-04 LAB — CBC
Hematocrit: 31.7 % — ABNORMAL LOW (ref 34.0–46.6)
Hemoglobin: 10.7 g/dL — ABNORMAL LOW (ref 11.1–15.9)
MCH: 31.9 pg (ref 26.6–33.0)
MCHC: 33.8 g/dL (ref 31.5–35.7)
MCV: 95 fL (ref 79–97)
Platelets: 219 10*3/uL (ref 150–450)
RBC: 3.35 x10E6/uL — ABNORMAL LOW (ref 3.77–5.28)
RDW: 11.8 % (ref 11.7–15.4)
WBC: 7.1 10*3/uL (ref 3.4–10.8)

## 2021-07-04 LAB — HIV ANTIBODY (ROUTINE TESTING W REFLEX): HIV Screen 4th Generation wRfx: NONREACTIVE

## 2021-07-08 ENCOUNTER — Encounter: Payer: Self-pay | Admitting: *Deleted

## 2021-07-08 ENCOUNTER — Encounter: Payer: Medicaid Other | Admitting: Certified Nurse Midwife

## 2021-07-10 ENCOUNTER — Ambulatory Visit (INDEPENDENT_AMBULATORY_CARE_PROVIDER_SITE_OTHER): Payer: Medicaid Other | Admitting: *Deleted

## 2021-07-10 VITALS — BP 105/60 | HR 92 | Ht 63.5 in | Wt 157.5 lb

## 2021-07-10 DIAGNOSIS — Z8759 Personal history of other complications of pregnancy, childbirth and the puerperium: Secondary | ICD-10-CM

## 2021-07-10 DIAGNOSIS — Z8719 Personal history of other diseases of the digestive system: Secondary | ICD-10-CM

## 2021-07-10 NOTE — Progress Notes (Signed)
Pt presents for scheduled makena injection. She c/o itching and rash @ site of previous Makena injection on 3/24 - upper Lt arm. Site examined and found to be slightly reddened with small area of macular rash. No swelling observed.  Dr. Crissie Reese in to see pt. She was advised that she may use topical antihistamine cream such as Benadryl or hydrocortisone if desired. Following discussion of recent Makena studies, pt opted to discontinue therapy with weekly Makena injections. She will keep next appt as scheduled on 4/6. ?

## 2021-07-11 LAB — COMPREHENSIVE METABOLIC PANEL
ALT: 27 IU/L (ref 0–32)
AST: 22 IU/L (ref 0–40)
Albumin/Globulin Ratio: 1.6 (ref 1.2–2.2)
Albumin: 4 g/dL (ref 3.9–5.0)
Alkaline Phosphatase: 144 IU/L — ABNORMAL HIGH (ref 44–121)
BUN/Creatinine Ratio: 14 (ref 9–23)
BUN: 9 mg/dL (ref 6–20)
Bilirubin Total: 0.3 mg/dL (ref 0.0–1.2)
CO2: 18 mmol/L — ABNORMAL LOW (ref 20–29)
Calcium: 9.2 mg/dL (ref 8.7–10.2)
Chloride: 101 mmol/L (ref 96–106)
Creatinine, Ser: 0.64 mg/dL (ref 0.57–1.00)
Globulin, Total: 2.5 g/dL (ref 1.5–4.5)
Glucose: 126 mg/dL — ABNORMAL HIGH (ref 70–99)
Potassium: 3.8 mmol/L (ref 3.5–5.2)
Sodium: 136 mmol/L (ref 134–144)
Total Protein: 6.5 g/dL (ref 6.0–8.5)
eGFR: 123 mL/min/{1.73_m2} (ref >59)

## 2021-07-11 LAB — BILE ACIDS, TOTAL: Bile Acids Total: 7.2 umol/L (ref 0.0–10.0)

## 2021-07-16 ENCOUNTER — Ambulatory Visit (INDEPENDENT_AMBULATORY_CARE_PROVIDER_SITE_OTHER): Payer: Medicaid Other | Admitting: Obstetrics and Gynecology

## 2021-07-16 VITALS — BP 95/64 | HR 102 | Wt 157.4 lb

## 2021-07-16 DIAGNOSIS — Z3A30 30 weeks gestation of pregnancy: Secondary | ICD-10-CM

## 2021-07-16 DIAGNOSIS — L299 Pruritus, unspecified: Secondary | ICD-10-CM

## 2021-07-16 DIAGNOSIS — O2343 Unspecified infection of urinary tract in pregnancy, third trimester: Secondary | ICD-10-CM

## 2021-07-16 DIAGNOSIS — O99713 Diseases of the skin and subcutaneous tissue complicating pregnancy, third trimester: Secondary | ICD-10-CM

## 2021-07-16 NOTE — Progress Notes (Signed)
? ? ?  PRENATAL VISIT NOTE ? ?Subjective:  ?Gloria Mann is a 31 y.o. [phone] at [redacted]w[redacted]d being seen today for ongoing prenatal care.  She is currently monitored for the following issues for this high-risk pregnancy and has Supervision of other normal pregnancy, antepartum; History of preterm delivery, currently pregnant; History of cholestasis during pregnancy; Chest wall pain; Pregnancy related fatigue in second trimester; Urinary tract infection in mother during third trimester of pregnancy; and Pruritus of pregnancy in third trimester on their problem list. ? ?Patient reports  stable itching .  Contractions: Irritability. Vag. Bleeding: None.  Movement: Present. Denies leaking of fluid.  ? ?The following portions of the patient's history were reviewed and updated as appropriate: allergies, current medications, past family history, past medical history, past social history, past surgical history and problem list.  ? ?Objective:  ? ?Vitals:  ? 07/16/21 1049  ?BP: 95/64  ?Pulse: (!) 102  ?Weight: 157 lb 6.4 oz (71.4 kg)  ? ? ?Fetal Status: Fetal Heart Rate (bpm): 141 Fundal Height: 31 cm Movement: Present    ? ?General:  Alert, oriented and cooperative. Patient is in no acute distress.  ?Skin: Skin is warm and dry. No rash noted.   ?Cardiovascular: Normal heart rate noted  ?Respiratory: Normal respiratory effort, no problems with respiration noted  ?Abdomen: Soft, gravid, appropriate for gestational age.  Pain/Pressure: Present     ?Pelvic: Cervical exam deferred        ?Extremities: Normal range of motion.  Edema: None  ?Mental Status: Normal mood and affect. Normal behavior. Normal judgment and thought content.  ? ?Assessment and Plan:  ?Pregnancy: [id number] at [redacted]w[redacted]d ?1. Pruritus of pregnancy in third trimester ?BA on 3/31 was 7.2; will recheck today ?Normal growth on 3/15 32% 1011gm, ac 61%; rpt prn ? ?2. Pregnancy ?BTL papers re-signed today ? ?3. H/o PTB ?Pt finished up last makena. Has injection site reaction on  b/l arms, left UE worse than right but it looks like it's resolving and no signs of infection; it looks like dry skin and healed excoriation on both sides. Recommend continued prn otc steroid cream and moisturizer ? ?4. UTI in pregnancy ?Ucx toc next visit ? ?Preterm labor symptoms and general obstetric precautions including but not limited to vaginal bleeding, contractions, leaking of fluid and fetal movement were reviewed in detail with the patient. ?Please refer to After Visit Summary for other counseling recommendations.  ? ?Return in about 2 weeks (around 07/30/2021) for md or app, low risk ob, in person. ? ?Future Appointments  ?Date Time Provider [place]  ?07/31/2021 11:15 AM Aletha Halim, MD [facility]  ?08/14/2021 10:35 AM Luvenia Redden, PA-C [facility]  ? ? ?Aletha Halim, MD ? ?

## 2021-07-17 ENCOUNTER — Inpatient Hospital Stay (HOSPITAL_BASED_OUTPATIENT_CLINIC_OR_DEPARTMENT_OTHER): Payer: Medicaid Other

## 2021-07-17 ENCOUNTER — Inpatient Hospital Stay (HOSPITAL_COMMUNITY)
Admission: AD | Admit: 2021-07-17 | Discharge: 2021-07-18 | Disposition: A | Discharge status: Home / Self Care | Payer: Medicaid Other | Attending: Obstetrics & Gynecology | Admitting: Obstetrics & Gynecology

## 2021-07-17 ENCOUNTER — Encounter (HOSPITAL_COMMUNITY): Payer: Self-pay | Admitting: Obstetrics & Gynecology

## 2021-07-17 DIAGNOSIS — O09213 Supervision of pregnancy with history of pre-term labor, third trimester: Secondary | ICD-10-CM | POA: Diagnosis not present

## 2021-07-17 DIAGNOSIS — Z3A30 30 weeks gestation of pregnancy: Secondary | ICD-10-CM

## 2021-07-17 DIAGNOSIS — O4693 Antepartum hemorrhage, unspecified, third trimester: Secondary | ICD-10-CM | POA: Diagnosis not present

## 2021-07-17 DIAGNOSIS — O99713 Diseases of the skin and subcutaneous tissue complicating pregnancy, third trimester: Secondary | ICD-10-CM | POA: Insufficient documentation

## 2021-07-17 DIAGNOSIS — O09293 Supervision of pregnancy with other poor reproductive or obstetric history, third trimester: Secondary | ICD-10-CM | POA: Insufficient documentation

## 2021-07-17 DIAGNOSIS — O479 False labor, unspecified: Secondary | ICD-10-CM

## 2021-07-17 DIAGNOSIS — Z3689 Encounter for other specified antenatal screening: Secondary | ICD-10-CM | POA: Diagnosis not present

## 2021-07-17 DIAGNOSIS — L299 Pruritus, unspecified: Secondary | ICD-10-CM

## 2021-07-17 DIAGNOSIS — Z3A35 35 weeks gestation of pregnancy: Secondary | ICD-10-CM | POA: Insufficient documentation

## 2021-07-17 DIAGNOSIS — O2343 Unspecified infection of urinary tract in pregnancy, third trimester: Secondary | ICD-10-CM | POA: Insufficient documentation

## 2021-07-17 HISTORY — DX: Diseases of the skin and subcutaneous tissue complicating pregnancy, third trimester: L29.9

## 2021-07-17 HISTORY — DX: Pruritus, unspecified: O99.713

## 2021-07-17 LAB — URINALYSIS, ROUTINE W REFLEX MICROSCOPIC
Bilirubin Urine: NEGATIVE
Glucose, UA: NEGATIVE mg/dL
Ketones, ur: NEGATIVE mg/dL
Nitrite: NEGATIVE
Protein, ur: NEGATIVE mg/dL
Specific Gravity, Urine: 1.011 (ref 1.005–1.030)
pH: 7 (ref 5.0–8.0)

## 2021-07-17 LAB — COMPREHENSIVE METABOLIC PANEL
ALT: 25 IU/L (ref 0–32)
AST: 21 IU/L (ref 0–40)
Albumin/Globulin Ratio: 1.5 (ref 1.2–2.2)
Albumin: 4 g/dL (ref 3.9–5.0)
Alkaline Phosphatase: 150 IU/L — ABNORMAL HIGH (ref 44–121)
BUN/Creatinine Ratio: 16 (ref 9–23)
BUN: 9 mg/dL (ref 6–20)
Bilirubin Total: 0.3 mg/dL (ref 0.0–1.2)
CO2: 17 mmol/L — ABNORMAL LOW (ref 20–29)
Calcium: 9.3 mg/dL (ref 8.7–10.2)
Chloride: 101 mmol/L (ref 96–106)
Creatinine, Ser: 0.58 mg/dL (ref 0.57–1.00)
Globulin, Total: 2.6 g/dL (ref 1.5–4.5)
Glucose: 71 mg/dL (ref 70–99)
Potassium: 4.1 mmol/L (ref 3.5–5.2)
Sodium: 135 mmol/L (ref 134–144)
Total Protein: 6.6 g/dL (ref 6.0–8.5)
eGFR: 126 mL/min/{1.73_m2} (ref >59)

## 2021-07-17 LAB — CBC
HCT: 30.4 % — ABNORMAL LOW (ref 36.0–46.0)
Hemoglobin: 10.7 g/dL — ABNORMAL LOW (ref 12.0–15.0)
MCH: 33.3 pg (ref 26.0–34.0)
MCHC: 35.2 g/dL (ref 30.0–36.0)
MCV: 94.7 fL (ref 80.0–100.0)
Platelets: 221 10*3/uL (ref 150–400)
RBC: 3.21 MIL/uL — ABNORMAL LOW (ref 3.87–5.11)
RDW: 12.5 % (ref 11.5–15.5)
WBC: 7.6 10*3/uL (ref 4.0–10.5)
nRBC: 0 % (ref 0.0–0.2)

## 2021-07-17 LAB — AMNISURE RUPTURE OF MEMBRANE (ROM) NOT AT ARMC: Amnisure ROM: NEGATIVE

## 2021-07-17 LAB — BILE ACIDS, TOTAL: Bile Acids Total: 2.6 umol/L (ref 0.0–10.0)

## 2021-07-17 MED ORDER — CYCLOBENZAPRINE HCL 5 MG PO TABS
10.0000 mg | ORAL_TABLET | Freq: Once | ORAL | Status: AC
  Administered 2021-07-17: 10 mg via ORAL
  Filled 2021-07-17: qty 2

## 2021-07-17 MED ORDER — BETAMETHASONE SOD PHOS & ACET 6 (3-3) MG/ML IJ SUSP
12.0000 mg | Freq: Once | INTRAMUSCULAR | Status: AC
  Administered 2021-07-18: 12 mg via INTRAMUSCULAR
  Filled 2021-07-17: qty 5

## 2021-07-17 MED ORDER — LACTATED RINGERS IV BOLUS
1000.0000 mL | Freq: Once | INTRAVENOUS | Status: AC
  Administered 2021-07-17: 1000 mL via INTRAVENOUS

## 2021-07-17 MED ORDER — ACETAMINOPHEN 500 MG PO TABS
1000.0000 mg | ORAL_TABLET | Freq: Once | ORAL | Status: AC
  Administered 2021-07-17: 1000 mg via ORAL
  Filled 2021-07-17: qty 2

## 2021-07-17 NOTE — MAU Provider Note (Signed)
?History  ?  ? ?CSN: [id number] ? ?Arrival date and time: 07/17/21 2051 ? ? Event Date/Time  ? First Provider Initiated Contact with Patient 07/17/21 2147   ?  ? ?Chief Complaint  ?Patient presents with  ? Vaginal Bleeding  ? Abdominal Pain  ? ?HPI ?Gloria Mann is a  31 y.o. [phone] at [redacted]w[redacted]d who presents to MAU with chief complaint of vaginal bleeding. This is a new problem, onset overnight. Patient's bleeding is dark brown. She thinks she lost her mucus plug but does not think her water has broken. She denies abdominal tenderness, DFM, dysuria, fever. She is remote from sexual intercourse. ? ?Patient also reports lower abdominal and low back pain, onset yesterday after her prenatal appointment and intensifying over time. Pain score is 8/10. She prefers to avoid medication and so has not taken anything for this complaint. ? ?Ob history significant for SVD at 33 and 35 weeks. She receives weekly 17-P. Care at [facility]. ? ?OB History   ? ? Gravida  ?5  ? Para  ?2  ? Term  ?0  ? Preterm  ?2  ? AB  ?2  ? Living  ?2  ?  ? ? SAB  ?1  ? IAB  ?1  ? Ectopic  ?0  ? Multiple  ?   ? Live Births  ?2  ?   ?  ? Obstetric Comments  ?Daughter was born at 10 weeks pt had ICP ?Son was born at 35 weeks pt had ICP  ?Both born at [facility] in [place]   ?  ? ?  ? ? ?Past Medical History:  ?Diagnosis Date  ? Anxiety   ? Eczema   ? Headache   ? Herpes genitalis   ? Intrahepatic cholestasis of pregnancy   ? UTI (urinary tract infection)   ? ? ?Past Surgical History:  ?Procedure Laterality Date  ? NO PAST SURGERIES    ? ? ?Family History  ?Problem Relation Age of Onset  ? Depression Mother   ? Cancer Mother   ?     breast  ? High Cholesterol Mother   ? Thyroid disease Mother   ? Bipolar disorder Mother   ? Hyperlipidemia Father   ? Hypertension Father   ? Diabetes Paternal Aunt   ? Colon cancer Paternal Uncle   ? ? ?Social History  ? ?Tobacco Use  ? Smoking status: Never  ? Smokeless tobacco: Never  ?Vaping Use  ? Vaping Use: Never used   ?Substance Use Topics  ? Alcohol use: Not Currently  ? Drug use: Not Currently  ?  Types: Marijuana  ?  Comment: last time smoked 12/25/2020  ? ? ?Allergies:  ?Allergies  ?Allergen Reactions  ? Eggs Or Egg-Derived Products Rash  ?  Other reaction(s): Unknown  ? Morphine And Related   ?  Doesn't remember. ?Thinks she had itching and maybe her lips swelled ?  ? ? ?Medications Prior to Admission  ?Medication Sig Dispense Refill Last Dose  ? Prenatal Vit-Fe Fumarate-FA (PREPLUS) 27-1 MG TABS Take 1 tablet by mouth daily. 30 tablet 12 07/17/2021  ? cefadroxil (DURICEF) 500 MG capsule Take 1 capsule (500 mg total) by mouth 2 (two) times daily. (Patient not taking: Reported on 07/03/2021) 14 capsule 0   ? cyclobenzaprine (FLEXERIL) 10 MG tablet Take 1 tablet (10 mg total) by mouth 2 (two) times daily as needed for muscle spasms. (Patient not taking: Reported on 07/03/2021) 20 tablet 0   ? Elastic Bandages &  Supports (COMFORT FIT MATERNITY SUPP MED) MISC 1 Units by Does not apply route daily. (Patient not taking: Reported on 07/03/2021) 1 each 0   ? MAKENA autoinjector Inject into the skin. (Patient not taking: Reported on 07/03/2021)     ? metroNIDAZOLE (FLAGYL) 500 MG tablet Take 1 tablet (500 mg total) by mouth 2 (two) times daily. (Patient not taking: Reported on 07/03/2021) 14 tablet 0   ? ? ?Review of Systems  ?Gastrointestinal:  Positive for abdominal pain.  ?Genitourinary:  Positive for vaginal bleeding.  ?Musculoskeletal:  Positive for back pain.  ?All other systems reviewed and are negative. ?Physical Exam  ? ?Blood pressure 115/77, pulse (!) 113, temperature 98.7 ?F (37.1 ?C), resp. rate 18, weight 73.5 kg, last menstrual period 12/16/2020. ? ?Physical Exam ?Vitals and nursing note reviewed. Exam conducted with a chaperone present.  ?Constitutional:   ?   Appearance: She is well-developed. She is not ill-appearing.  ?Cardiovascular:  ?   Rate and Rhythm: Tachycardia present.  ?Pulmonary:  ?   Effort: Pulmonary effort  is normal.  ?   Breath sounds: Normal breath sounds.  ?Abdominal:  ?   Comments: Gravid  ?Genitourinary: ?   Comments: Pelvic exam: External genitalia normal, vaginal walls pink and well rugated, cervix visually closed, no lesions noted. Scant dark red blood on tip of speculum. Otherwise no bleeding observed ? ? ?Skin: ?   Capillary Refill: Capillary refill takes less than 2 seconds.  ?Neurological:  ?   Mental Status: She is alert and oriented to person, place, and time.  ?Psychiatric:     ?   Mood and Affect: Mood normal.     ?   Behavior: Behavior normal.  ? ? ?MAU Course  ?Procedures ? ?MDM ?--Anterior placenta, cervix documented as long, closed on most recent MFM scan 06/24/2021 ?--Cervix remains 1/thick/very posterior/ fetus ballotable, unchanged from early March ?--Scan dark red blood on speculum exam, no blood on sterile glove used for check ?--Reactive tracing: baseline 145, mod var, + accels, variable decel x 1, reviewed by Dr. Macon Large ?--UI ?--Intact amniotic sac. Negative pooling, negative fern, negative Amnisure ?--Pertinent negatives: non-reassuring fetal status, abdominal tenderness, cervical dilation, overt bleeding ?--Discussed with Dr. Macon Large. Will initiate BMZ course, observe for preterm labor and additional episodes of bleeding  ?--2330: CNM returned to bedside. Patient resting in [place] with husband sleeping on her lap. States abdominal pain has resolved, low back pain lingers. Patient and husband agreeable to BMZ course ? ? ? ?Patient Vitals for the past 24 hrs: ? BP Temp Pulse Resp Weight  ?07/18/21 0059 114/62 -- 78 -- --  ?07/17/21 2122 115/77 98.7 ?F (37.1 ?C) (!) 113 18 73.5 kg  ? ?Results for orders placed or performed during the hospital encounter of 07/17/21 (from the past 24 hour(s))  ?Urinalysis, Routine w reflex microscopic Urine, Clean Catch     Status: Abnormal  ? Collection Time: 07/17/21  9:30 PM  ?Result Value Ref Range  ? Color, Urine YELLOW YELLOW  ? APPearance HAZY  (A) CLEAR  ? Specific Gravity, Urine 1.011 1.005 - 1.030  ? pH 7.0 5.0 - 8.0  ? Glucose, UA NEGATIVE NEGATIVE mg/dL  ? Hgb urine dipstick MODERATE (A) NEGATIVE  ? Bilirubin Urine NEGATIVE NEGATIVE  ? Ketones, ur NEGATIVE NEGATIVE mg/dL  ? Protein, ur NEGATIVE NEGATIVE mg/dL  ? Nitrite NEGATIVE NEGATIVE  ? Leukocytes,Ua SMALL (A) NEGATIVE  ? RBC / HPF 21-50 0 - 5 RBC/hpf  ? WBC, UA 6-10  0 - 5 WBC/hpf  ? Bacteria, UA MANY (A) NONE SEEN  ? Squamous Epithelial / LPF 6-10 0 - 5  ? Mucus PRESENT   ?Amnisure rupture of membrane (rom)[facility]     Status: None  ? Collection Time: 07/17/21 10:08 PM  ?Result Value Ref Range  ? Amnisure ROM NEGATIVE   ?CBC     Status: Abnormal  ? Collection Time: 07/17/21 10:16 PM  ?Result Value Ref Range  ? WBC 7.6 4.0 - 10.5 K/uL  ? RBC 3.21 (L) 3.87 - 5.11 MIL/uL  ? Hemoglobin 10.7 (L) 12.0 - 15.0 g/dL  ? HCT 30.4 (L) 36.0 - 46.0 %  ? MCV 94.7 80.0 - 100.0 fL  ? MCH 33.3 26.0 - 34.0 pg  ? MCHC 35.2 30.0 - 36.0 g/dL  ? RDW 12.5 11.5 - 15.5 %  ? Platelets 221 150 - 400 K/uL  ? nRBC 0.0 0.0 - 0.2 %  ? ?Meds ordered this encounter  ?Medications  ? lactated ringers bolus 1,000 mL  ? acetaminophen (TYLENOL) tablet 1,000 mg  ? cyclobenzaprine (FLEXERIL) tablet 10 mg  ? betamethasone acetate-betamethasone sodium phosphate (CELESTONE) injection 12 mg  ? ?Assessment and Plan  ?--31 y.o. [id number] at 9326w4d  ?--One episode of vaginal bleeding ?--Intact amniotic sac ?--No acute findings on MFM OB Limited scan ?--Cervix remains 1/thick/ballotable c/w previous assessments ?--Hgb 10.7 ?--Rh + ?--Care coordinated with Dr. Macon LargeAnyanwu ?--Discharge home in stable condition ? ?F/U: ?--Return to MAU in 24 hours for BMZ 2 of 2. ? ?Calvert CantorSamantha C Madia Carvell, CNM ?07/18/2021, 3:25 AM  ?

## 2021-07-17 NOTE — MAU Note (Signed)
.  Gloria Mann is a 31 y.o. at [redacted]w[redacted]d here in MAU reporting: she has had back pain and abd cramping on and off since last night. Thinks her mucus plug came out earlier today and now she is having brown/bleeding discharge. Good fetal movement felt throughout the day.  ?LMP:  ?Onset of complaint: yesterday ?Pain score: 8/10 ?Vitals:  ? 07/17/21 2122  ?BP: 115/77  ?Pulse: (!) 113  ?Resp: 18  ?Temp: 98.7 ?F (37.1 ?C)  ?   ?FHT:143 ?Lab orders placed from triage:  u/a ? ? ?

## 2021-07-18 DIAGNOSIS — O479 False labor, unspecified: Secondary | ICD-10-CM

## 2021-07-19 ENCOUNTER — Inpatient Hospital Stay (HOSPITAL_COMMUNITY)
Admission: AD | Admit: 2021-07-19 | Discharge: 2021-07-19 | Disposition: A | Discharge status: Home / Self Care | Payer: Medicaid Other | Attending: Obstetrics & Gynecology | Admitting: Obstetrics & Gynecology

## 2021-07-19 ENCOUNTER — Other Ambulatory Visit: Payer: Self-pay

## 2021-07-19 DIAGNOSIS — Z09 Encounter for follow-up examination after completed treatment for conditions other than malignant neoplasm: Secondary | ICD-10-CM | POA: Diagnosis not present

## 2021-07-19 DIAGNOSIS — O09899 Supervision of other high risk pregnancies, unspecified trimester: Secondary | ICD-10-CM

## 2021-07-19 DIAGNOSIS — Z3A30 30 weeks gestation of pregnancy: Secondary | ICD-10-CM | POA: Diagnosis not present

## 2021-07-19 MED ORDER — BETAMETHASONE SOD PHOS & ACET 6 (3-3) MG/ML IJ SUSP
12.0000 mg | Freq: Once | INTRAMUSCULAR | Status: AC
  Administered 2021-07-19: 12 mg via INTRAMUSCULAR

## 2021-07-19 NOTE — MAU Provider Note (Signed)
Event Date/Time  ? First Provider Initiated Contact with Patient 07/19/21 [phone]   ?  ? ?S ?Ms. Gloria Mann is a 31 y.o. [phone] patient who presents to MAU today for her second dose of betamethasone. She was seen in MAU for threatened preterm labor and given her first dose on 4/8. She denies any pain, vaginal bleeding or leaking of fluid.  She reports normal fetal movement.  ? ?O ?BP 108/61 (BP Location: Right Arm)   Pulse 95   Temp 98.7 ?F (37.1 ?C) (Oral)   Resp 20   Ht 5\' 4"  (1.626 m)   Wt 74.3 kg   LMP 12/16/2020 (Exact Date)   SpO2 100%   BMI 28.13 kg/m?  ?Physical Exam ?Vitals and nursing note reviewed.  ?Constitutional:   ?   General: She is not in acute distress. ?   Appearance: She is well-developed.  ?HENT:  ?   Head: Normocephalic.  ?Eyes:  ?   Pupils: Pupils are equal, round, and reactive to light.  ?Cardiovascular:  ?   Rate and Rhythm: Normal rate and regular rhythm.  ?   Heart sounds: Normal heart sounds.  ?Pulmonary:  ?   Effort: Pulmonary effort is normal. No respiratory distress.  ?   Breath sounds: Normal breath sounds.  ?Abdominal:  ?   General: Bowel sounds are normal. There is no distension.  ?   Palpations: Abdomen is soft.  ?   Tenderness: There is no abdominal tenderness.  ?Skin: ?   General: Skin is warm and dry.  ?Neurological:  ?   Mental Status: She is alert and oriented to person, place, and time.  ?Psychiatric:     ?   Mood and Affect: Mood normal.     ?   Behavior: Behavior normal.     ?   Thought Content: Thought content normal.     ?   Judgment: Judgment normal.  ? ?FHT: 146 bpm ? ?A ?Medical screening exam complete ?1. History of preterm delivery, currently pregnant   ?2. Follow-up exam   ?3. [redacted] weeks gestation of pregnancy   ? ?-BMZ #2 ? ?P ?-Discharge home in stable condition ?-Preterm labor precautions discussed ?-Patient advised to follow-up with OB as scheduled for prenatal care ?-Patient may return to MAU as needed or if her condition were to change or  worsen ? ? ?02/15/2021, CNM ?07/19/2021 9:04 AM  ? ?

## 2021-07-19 NOTE — MAU Note (Signed)
.  Gloria Mann is a 31 y.o. at [redacted]w[redacted]d here in MAU reporting: here to receive 2nd dose of Betamethasone.  Endorses +FM.  Reports continue to spot since Friday.  Denies LOF. ? ?Onset of complaint: N/A ?Pain score: 0 ?Vitals:  ? 07/19/21 0857  ?BP: 108/61  ?Pulse: 95  ?Resp: 20  ?Temp: 98.7 ?F (37.1 ?C)  ?SpO2: 100%  ?   ?FHT: 146 bpm ?Lab orders placed from triage:  None  ?

## 2021-07-19 NOTE — Discharge Instructions (Signed)

## 2021-07-20 LAB — CULTURE, OB URINE: Culture: >=100000 — AB

## 2021-07-21 ENCOUNTER — Encounter: Payer: Self-pay | Admitting: Advanced Practice Midwife

## 2021-07-21 ENCOUNTER — Other Ambulatory Visit: Payer: Self-pay | Admitting: Advanced Practice Midwife

## 2021-07-21 MED ORDER — CEFADROXIL 500 MG PO CAPS
500.0000 mg | ORAL_CAPSULE | Freq: Two times a day (BID) | ORAL | 0 refills | Status: DC
Start: 2021-07-21 — End: 2021-08-24

## 2021-07-21 NOTE — Progress Notes (Signed)
+   UTI per urine culture. Patient notification via active MyChart account. ? ?Gloria Mann, [place], MSN, CNM ?Certified Nurse Midwife, [place] ?[facility] ? ?

## 2021-07-23 ENCOUNTER — Encounter: Payer: Self-pay | Admitting: Obstetrics and Gynecology

## 2021-07-24 ENCOUNTER — Telehealth: Payer: Medicaid Other | Admitting: Obstetrics and Gynecology

## 2021-07-25 ENCOUNTER — Encounter (HOSPITAL_COMMUNITY): Payer: Self-pay | Admitting: Obstetrics and Gynecology

## 2021-07-25 ENCOUNTER — Inpatient Hospital Stay (HOSPITAL_COMMUNITY)
Admission: AD | Admit: 2021-07-25 | Discharge: 2021-07-25 | Disposition: A | Discharge status: Home / Self Care | Payer: Medicaid Other | Attending: Obstetrics and Gynecology | Admitting: Obstetrics and Gynecology

## 2021-07-25 DIAGNOSIS — O2343 Unspecified infection of urinary tract in pregnancy, third trimester: Secondary | ICD-10-CM | POA: Insufficient documentation

## 2021-07-25 DIAGNOSIS — O4703 False labor before 37 completed weeks of gestation, third trimester: Secondary | ICD-10-CM | POA: Diagnosis not present

## 2021-07-25 DIAGNOSIS — O368130 Decreased fetal movements, third trimester, not applicable or unspecified: Secondary | ICD-10-CM | POA: Diagnosis present

## 2021-07-25 DIAGNOSIS — N390 Urinary tract infection, site not specified: Secondary | ICD-10-CM | POA: Diagnosis not present

## 2021-07-25 DIAGNOSIS — O09899 Supervision of other high risk pregnancies, unspecified trimester: Secondary | ICD-10-CM

## 2021-07-25 DIAGNOSIS — Z3480 Encounter for supervision of other normal pregnancy, unspecified trimester: Secondary | ICD-10-CM

## 2021-07-25 DIAGNOSIS — O09213 Supervision of pregnancy with history of pre-term labor, third trimester: Secondary | ICD-10-CM | POA: Diagnosis not present

## 2021-07-25 DIAGNOSIS — Z8719 Personal history of other diseases of the digestive system: Secondary | ICD-10-CM

## 2021-07-25 DIAGNOSIS — Z3A31 31 weeks gestation of pregnancy: Secondary | ICD-10-CM

## 2021-07-25 LAB — URINALYSIS, ROUTINE W REFLEX MICROSCOPIC
Bilirubin Urine: NEGATIVE
Glucose, UA: NEGATIVE mg/dL
Ketones, ur: NEGATIVE mg/dL
Nitrite: NEGATIVE
Protein, ur: 30 mg/dL — AB
Specific Gravity, Urine: 1.006 (ref 1.005–1.030)
WBC, UA: >50 WBC/hpf — ABNORMAL HIGH (ref 0–5)
pH: 6 (ref 5.0–8.0)

## 2021-07-25 LAB — FETAL FIBRONECTIN: Fetal Fibronectin: NEGATIVE

## 2021-07-25 MED ORDER — CEPHALEXIN 500 MG PO CAPS: 500.0000 mg | ORAL_CAPSULE | Freq: Every day | ORAL | 2 refills | Status: DC

## 2021-07-25 MED ORDER — CEFTRIAXONE SODIUM 1 G IJ SOLR
1.0000 g | INTRAMUSCULAR | Status: AC
  Administered 2021-07-25: 1 g via INTRAMUSCULAR
  Filled 2021-07-25: qty 10

## 2021-07-25 MED ORDER — LIDOCAINE HCL (PF) 1 % IJ SOLN: 5.0000 mL | Freq: Once | INTRAMUSCULAR | Status: DC

## 2021-07-25 MED ORDER — TERBUTALINE SULFATE 1 MG/ML IJ SOLN
0.2500 mg | Freq: Once | INTRAMUSCULAR | Status: AC
  Administered 2021-07-25: 0.25 mg via SUBCUTANEOUS
  Filled 2021-07-25: qty 1

## 2021-07-25 MED ORDER — LIDOCAINE HCL (PF) 1 % IJ SOLN
2.1000 mL | Freq: Once | INTRAMUSCULAR | Status: AC
Start: 2021-07-25 — End: 2021-07-25
  Administered 2021-07-25: 2.1 mL
  Filled 2021-07-25: qty 5

## 2021-07-25 NOTE — Discharge Instructions (Signed)
Reasons to return to MAU at [facility]:  Since you are preterm, return to MAU if:  1.  Contractions are 10 minutes apart or less and they becoming more uncomfortable or painful over time 2.  You have a large gush of fluid, or a trickle of fluid that will not stop and you have to wear a pad 3.  You have bleeding that is bright red, heavier than spotting--like menstrual bleeding (spotting can be normal in early labor or after a check of your cervix) 4.  You do not feel the baby moving like he/she normally does  

## 2021-07-25 NOTE — MAU Provider Note (Addendum)
Chief Complaint:  Contractions and Decreased Fetal Movement ? ? Event Date/Time  ? First Provider Initiated Contact with Patient 07/25/21 0533   ?  ? ?HPI: Gloria Mann is a 31 y.o. [id number] at 6854w4d by LMP who presents to maternity admissions reporting painful contractions and decreased fetal movement today. She has hx preterm delivery x 2 and was concerned as contractions became stronger and closer together.  She was diagnosed with UTI on 07/17/21 but has not picked up her antibiotics. This is her 4th or 5th UTI in this pregnancy per the pt. ?She reports good fetal movement. ? ? ?HPI ? ?Past Medical History: ?Past Medical History:  ?Diagnosis Date  ? Anxiety   ? Eczema   ? Headache   ? Herpes genitalis   ? Intrahepatic cholestasis of pregnancy   ? UTI (urinary tract infection)   ? ? ?Past obstetric history: ?OB History  ?Gravida Para Term Preterm AB Living  ?5 2 0 2 2 2   ?SAB IAB Ectopic Multiple Live Births  ?1 1 0   2  ?  ?# Outcome Date GA Lbr Len/2nd Weight Sex Delivery Anes PTL Lv  ?5 Current           ?4 Preterm 08/30/15 6463w0d   M Vag-Spont   LIV  ?3 IAB 2015          ?2 Preterm 01/13/13 5327w0d   F Vag-Spont   LIV  ?1 SAB 2013          ?  ?Obstetric Comments  ?Daughter was born at 4133 weeks pt had ICP  ?Son was born at 35 weeks pt had ICP   ?[place] at [facility] in [place]   ? ? ?Past Surgical History: ?Past Surgical History:  ?Procedure Laterality Date  ? NO PAST SURGERIES    ? ? ?Family History: ?Family History  ?Problem Relation Age of Onset  ? Depression Mother   ? Cancer Mother   ?     breast  ? High Cholesterol Mother   ? Thyroid disease Mother   ? Bipolar disorder Mother   ? Hyperlipidemia Father   ? Hypertension Father   ? Diabetes Paternal Aunt   ? Colon cancer Paternal Uncle   ? ? ?Social History: ?Social History  ? ?Tobacco Use  ? Smoking status: Never  ? Smokeless tobacco: Never  ?Vaping Use  ? Vaping Use: Never used  ?Substance Use Topics  ? Alcohol use: Not Currently  ? Drug use: Not Currently   ?  Types: Marijuana  ?  Comment: last time smoked 12/25/2020  ? ? ?Allergies:  ?Allergies  ?Allergen Reactions  ? Eggs Or Egg-Derived Products Rash  ?  Other reaction(s): Unknown  ? Morphine And Related   ?  Doesn't remember. ?Thinks she had itching and maybe her lips swelled ?  ? ? ?Meds:  ?Medications Prior to Admission  ?Medication Sig Dispense Refill Last Dose  ? Prenatal Vit-Fe Fumarate-FA (PREPLUS) 27-1 MG TABS Take 1 tablet by mouth daily. 30 tablet 12 07/24/2021  ? cefadroxil (DURICEF) 500 MG capsule Take 1 capsule (500 mg total) by mouth 2 (two) times daily. 14 capsule 0   ? cyclobenzaprine (FLEXERIL) 10 MG tablet Take 1 tablet (10 mg total) by mouth 2 (two) times daily as needed for muscle spasms. (Patient not taking: Reported on 07/03/2021) 20 tablet 0   ? Elastic Bandages & Supports (COMFORT FIT MATERNITY SUPP MED) MISC 1 Units by Does not apply route daily. (Patient not  taking: Reported on 07/03/2021) 1 each 0   ? ? ?ROS:  ?Review of Systems  ?Constitutional:  Negative for chills, fatigue and fever.  ?Eyes:  Negative for visual disturbance.  ?Respiratory:  Negative for shortness of breath.   ?Cardiovascular:  Negative for chest pain.  ?Gastrointestinal:  Positive for abdominal pain. Negative for nausea and vomiting.  ?Genitourinary:  Negative for difficulty urinating, dysuria, flank pain, pelvic pain, vaginal bleeding, vaginal discharge and vaginal pain.  ?Musculoskeletal:  Positive for back pain.  ?Neurological:  Negative for dizziness and headaches.  ?Psychiatric/Behavioral: Negative.    ? ? ?I have reviewed patient's Past Medical Hx, Surgical Hx, Family Hx, Social Hx, medications and allergies.  ? ?Physical Exam  ?Patient Vitals for the past 24 hrs: ? BP Temp Temp src Pulse Resp SpO2 Height Weight  ?07/25/21 0716 97/60 98.5 ?F (36.9 ?C) Oral (!) 104 -- -- -- --  ?07/25/21 0452 97/71 -- -- -- -- -- -- --  ?07/25/21 0449 -- 98.1 ?F (36.7 ?C) -- (!) 107 20 100 % 5\' 4"  (1.626 m) 72.6 kg  ? ?Constitutional:  Well-developed, well-nourished female in no acute distress.  ?Cardiovascular: normal rate ?Respiratory: normal effort ?GI: Abd soft, non-tender, gravid appropriate for gestational age.  ?MS: Extremities nontender, no edema, normal ROM ?Neurologic: Alert and oriented x 4.  ?GU: Neg CVAT. ? ?PELVIC EXAM:  ? ?Dilation: Fingertip ?Effacement (%): Thick ?Exam by:: [email or web address], CNM ? ?FHT:  Baseline 150 , moderate variability, accelerations present, no decelerations ?Contractions: q 1-2 mins ?  ?Labs: ?Results for orders placed or performed during the hospital encounter of 07/25/21 (from the past 24 hour(s))  ?Urinalysis, Routine w reflex microscopic Urine, Clean Catch     Status: Abnormal  ? Collection Time: 07/25/21  5:00 AM  ?Result Value Ref Range  ? Color, Urine STRAW (A) YELLOW  ? APPearance HAZY (A) CLEAR  ? Specific Gravity, Urine 1.006 1.005 - 1.030  ? pH 6.0 5.0 - 8.0  ? Glucose, UA NEGATIVE NEGATIVE mg/dL  ? Hgb urine dipstick MODERATE (A) NEGATIVE  ? Bilirubin Urine NEGATIVE NEGATIVE  ? Ketones, ur NEGATIVE NEGATIVE mg/dL  ? Protein, ur 30 (A) NEGATIVE mg/dL  ? Nitrite NEGATIVE NEGATIVE  ? Leukocytes,Ua LARGE (A) NEGATIVE  ? RBC / HPF 21-50 0 - 5 RBC/hpf  ? WBC, UA >50 (H) 0 - 5 WBC/hpf  ? Bacteria, UA RARE (A) NONE SEEN  ? Squamous Epithelial / LPF 11-20 0 - 5  ?Fetal fibronectin     Status: None  ? Collection Time: 07/25/21  5:45 AM  ?Result Value Ref Range  ? Fetal Fibronectin NEGATIVE NEGATIVE  ? ?O/Positive/-- (12/15 0900) ? ?Imaging:  ? ?MAU Course/MDM: ?Orders Placed This Encounter  ?Procedures  ? Culture, OB Urine  ? Urinalysis, Routine w reflex microscopic Urine, Clean Catch  ? Fetal fibronectin  ? Discharge patient  ?  ?Meds ordered this encounter  ?Medications  ? terbutaline (BRETHINE) injection 0.25 mg  ? cefTRIAXone (ROCEPHIN) injection 1 g  ?  Order Specific Question:   Antibiotic Indication:  ?  Answer:   UTI  ? cephALEXin (KEFLEX) 500 MG capsule  ?  Sig: Take 1 capsule (500 mg total) by  mouth at bedtime. Start daily cephalexin (Keflex) medication AFTER you finish course of twice daily cefadroxil (Duricef).  ?  Dispense:  30 capsule  ?  Refill:  2  ?  Order Specific Question:   Supervising Provider  ?  Answer:   07-27-2002 [  [id number]]  ? DISCONTD: lidocaine (PF) (XYLOCAINE) 1 % injection 5 mL  ? lidocaine (PF) (XYLOCAINE) 1 % injection 2.1 mL  ?  IM for Rocephin injection.  ?  ? ?NST reviewed and appropriate for gestational age ?Cervix unchanged in 2+ hours in MAU ?Terbutaline x 1 dose given SQ and contractions reduced to irregular Q 2-10 minutes. Pt feeling less Ctx after medication.  ?No evidence of PTL ?Rocephin 1000 mg IM given in MAU ?Pt to pick up Rx for Duricef to treat UTI, after completing 7 days of Duricef, pt to start Keflex 500 mg QHS for prophylaxis ?F/U in office, message sent to change virtual visit to in person ?Return to MAU as needed for emergencies ? ? ? ?Assessment: ?1. Supervision of other normal pregnancy, antepartum   ?2. History of preterm delivery, currently pregnant   ?3. History of cholestasis during pregnancy   ?4. Urinary tract infection in mother during third trimester of pregnancy   ?5. Preterm uterine contractions in third trimester, antepartum   ?6. [redacted] weeks gestation of pregnancy   ? ? ?Plan: ?Discharge home ?Labor precautions and fetal kick counts ? Follow-up Information   ? ? [facility] Follow [facility].   ?Specialty: Obstetrics and Gynecology ?Why: The clinic will call you with appointment. ?Contact information: ?[address] ?[place] [address] ?[phone] ? ?  ?  ? ? [facility] Follow up.   ?Specialty: Obstetrics and Gynecology ?Why: As needed for signs of labor or emergencies ?Contact information: ?[address] ?[id number] mc ?[place] [address] ?[phone] ? ?  ?  ? ?  ?  ? ?  ? ?Allergies as of 07/25/2021   ? ?   Reactions  ? Eggs Or  Egg-derived Products Rash  ? Other reaction(s): Unknown  ? Morphine And Related   ? Doesn't remember. ?Thinks she had itching and maybe her lips swelled  ? ?  ? ?  ?Medication List  ?  ? ?TAKE these medicat

## 2021-07-25 NOTE — MAU Note (Signed)
.  Gloria Mann is a 31 y.o. at [redacted]w[redacted]d here in MAU reporting ctxs since 0100. Having some bloody show since 07/17/21. Also states baby has not been moving as much as usual ?LMP: [facility] 09/22/21 ?Onset of complaint: 0100 ?Pain score: 10 ?Vitals:  ? 07/25/21 0449 07/25/21 0452  ?BP:  97/71  ?Pulse: (!) 107   ?Resp: 20   ?Temp: 98.1 ?F (36.7 ?C)   ?SpO2: 100%   ?   ?FHT:150 ?Lab orders placed from triage:   ? ?

## 2021-07-31 ENCOUNTER — Ambulatory Visit (INDEPENDENT_AMBULATORY_CARE_PROVIDER_SITE_OTHER): Payer: Medicaid Other | Admitting: Obstetrics and Gynecology

## 2021-07-31 VITALS — BP 104/61 | HR 118 | Wt 157.6 lb

## 2021-07-31 DIAGNOSIS — Z8719 Personal history of other diseases of the digestive system: Secondary | ICD-10-CM

## 2021-07-31 DIAGNOSIS — O2343 Unspecified infection of urinary tract in pregnancy, third trimester: Secondary | ICD-10-CM

## 2021-07-31 DIAGNOSIS — Z3480 Encounter for supervision of other normal pregnancy, unspecified trimester: Secondary | ICD-10-CM

## 2021-07-31 DIAGNOSIS — Z3A32 32 weeks gestation of pregnancy: Secondary | ICD-10-CM

## 2021-07-31 DIAGNOSIS — O09899 Supervision of other high risk pregnancies, unspecified trimester: Secondary | ICD-10-CM

## 2021-07-31 DIAGNOSIS — Z8759 Personal history of other complications of pregnancy, childbirth and the puerperium: Secondary | ICD-10-CM

## 2021-08-02 LAB — SUSCEPTIBILITY, AER + ANAEROB: Source of Sample: 8680

## 2021-08-02 LAB — SUSCEPTIBILITY RESULT

## 2021-08-02 NOTE — Progress Notes (Signed)
? ?  PRENATAL VISIT NOTE ? ?Subjective:  ?Gloria Mann is a 31 y.o. [phone] at [redacted]w[redacted]d being seen today for ongoing prenatal care.  She is currently monitored for the following issues for this high-risk pregnancy and has Supervision of other normal pregnancy, antepartum; History of preterm delivery, currently pregnant; History of cholestasis during pregnancy; Chest wall pain; Pregnancy related fatigue in second trimester; Urinary tract infection in mother during third trimester of pregnancy; and Pruritus of pregnancy in third trimester on their problem list. ? ?Patient reports no complaints.  Contractions: Irritability. Vag. Bleeding: None.  Movement: Present. Denies leaking of fluid.  ? ?The following portions of the patient's history were reviewed and updated as appropriate: allergies, current medications, past family history, past medical history, past social history, past surgical history and problem list.  ? ?Objective:  ? ?Vitals:  ? 07/31/21 1113  ?BP: 104/61  ?Pulse: (!) 118  ?Weight: 157 lb 9.6 oz (71.5 kg)  ? ? ?Fetal Status: Fetal Heart Rate (bpm): 147 Fundal Height: 32 cm Movement: Present    ? ?General:  Alert, oriented and cooperative. Patient is in no acute distress.  ?Skin: Skin is warm and dry. No rash noted.   ?Cardiovascular: Normal heart rate noted  ?Respiratory: Normal respiratory effort, no problems with respiration noted  ?Abdomen: Soft, gravid, appropriate for gestational age.  Pain/Pressure: Present     ?Pelvic: Cervical exam deferred        ?Extremities: Normal range of motion.  Edema: Trace  ?Mental Status: Normal mood and affect. Normal behavior. Normal judgment and thought content.  ? ?Assessment and Plan:  ?Pregnancy: [id number] at [redacted]w[redacted]d ?1. Supervision of other normal pregnancy, antepartum ?BTL papers UTD ? ?2. [redacted] weeks gestation of pregnancy ? ?3. Urinary tract infection in mother during third trimester of pregnancy ?Recurrent UTIs. Pt on treatment duricef with plan for qhs keflex. Plan  re-discussed with patient ? ?4. History of preterm delivery, currently pregnant ? ?5. History of cholestasis during pregnancy ?Had itching a few weeks ago and BA negative on 4/6; plan to recheck at 36wks and PRN ? ?Preterm labor symptoms and general obstetric precautions including but not limited to vaginal bleeding, contractions, leaking of fluid and fetal movement were reviewed in detail with the patient. ?Please refer to After Visit Summary for other counseling recommendations.  ? ?Return in about 2 weeks (around 08/14/2021) for in person, md or app, low risk ob. ? ?Future Appointments  ?Date Time Provider [place]  ?08/14/2021 10:35 AM Luvenia Redden, PA-C [facility]  ? ? ?Aletha Halim, MD  ?

## 2021-08-05 LAB — CULTURE, OB URINE: Culture: 70000 — AB

## 2021-08-06 ENCOUNTER — Telehealth: Payer: Medicaid Other | Admitting: Medical

## 2021-08-14 ENCOUNTER — Telehealth: Payer: Self-pay

## 2021-08-14 ENCOUNTER — Encounter: Payer: Self-pay | Admitting: Medical

## 2021-08-14 ENCOUNTER — Encounter: Payer: Medicaid Other | Admitting: Medical

## 2021-08-14 NOTE — Telephone Encounter (Signed)
Called Pt top start My Chart visit, no answer, left VM, to either log into My Chart or call to reschedule. ?

## 2021-08-14 NOTE — Patient Instructions (Signed)
Please reschedule as soon as possible  

## 2021-08-14 NOTE — Telephone Encounter (Signed)
Called Pt again to start My Chart visit, she asked if she could reschedule her appt, advised that front desk will be reaching out to her to reschedule. ?

## 2021-08-14 NOTE — Progress Notes (Signed)
Patient unable to keep virtual visit today. Will reschedule.  ? ?Vonzella Nipple, PA-C ?08/14/2021 11:07 AM  ? ?

## 2021-08-24 ENCOUNTER — Encounter (HOSPITAL_COMMUNITY): Payer: Self-pay | Admitting: Family Medicine

## 2021-08-24 ENCOUNTER — Inpatient Hospital Stay (HOSPITAL_COMMUNITY)
Admission: AD | Admit: 2021-08-24 | Discharge: 2021-08-24 | Disposition: A | Discharge status: Home / Self Care | Payer: Medicaid Other | Source: Home / Self Care | Attending: Family Medicine | Admitting: Family Medicine

## 2021-08-24 DIAGNOSIS — Z8719 Personal history of other diseases of the digestive system: Secondary | ICD-10-CM

## 2021-08-24 DIAGNOSIS — R7989 Other specified abnormal findings of blood chemistry: Secondary | ICD-10-CM | POA: Insufficient documentation

## 2021-08-24 DIAGNOSIS — Z3480 Encounter for supervision of other normal pregnancy, unspecified trimester: Secondary | ICD-10-CM

## 2021-08-24 DIAGNOSIS — O26893 Other specified pregnancy related conditions, third trimester: Secondary | ICD-10-CM | POA: Insufficient documentation

## 2021-08-24 DIAGNOSIS — Z3A35 35 weeks gestation of pregnancy: Secondary | ICD-10-CM | POA: Insufficient documentation

## 2021-08-24 DIAGNOSIS — O09899 Supervision of other high risk pregnancies, unspecified trimester: Secondary | ICD-10-CM

## 2021-08-24 DIAGNOSIS — O09213 Supervision of pregnancy with history of pre-term labor, third trimester: Secondary | ICD-10-CM | POA: Insufficient documentation

## 2021-08-24 DIAGNOSIS — L299 Pruritus, unspecified: Secondary | ICD-10-CM

## 2021-08-24 LAB — COMPREHENSIVE METABOLIC PANEL
ALT: 97 U/L — ABNORMAL HIGH (ref 0–44)
AST: 68 U/L — ABNORMAL HIGH (ref 15–41)
Albumin: 2.9 g/dL — ABNORMAL LOW (ref 3.5–5.0)
Alkaline Phosphatase: 192 U/L — ABNORMAL HIGH (ref 38–126)
Anion gap: 9 (ref 5–15)
BUN: 6 mg/dL (ref 6–20)
CO2: 19 mmol/L — ABNORMAL LOW (ref 22–32)
Calcium: 9.1 mg/dL (ref 8.9–10.3)
Chloride: 106 mmol/L (ref 98–111)
Creatinine, Ser: 0.66 mg/dL (ref 0.44–1.00)
GFR, Estimated: >60 mL/min (ref >60)
Glucose, Bld: 103 mg/dL — ABNORMAL HIGH (ref 70–99)
Potassium: 3.8 mmol/L (ref 3.5–5.1)
Sodium: 134 mmol/L — ABNORMAL LOW (ref 135–145)
Total Bilirubin: 0.6 mg/dL (ref 0.3–1.2)
Total Protein: 6.6 g/dL (ref 6.5–8.1)

## 2021-08-24 LAB — CBC
HCT: 32.7 % — ABNORMAL LOW (ref 36.0–46.0)
Hemoglobin: 11.6 g/dL — ABNORMAL LOW (ref 12.0–15.0)
MCH: 32.1 pg (ref 26.0–34.0)
MCHC: 35.5 g/dL (ref 30.0–36.0)
MCV: 90.6 fL (ref 80.0–100.0)
Platelets: 192 10*3/uL (ref 150–400)
RBC: 3.61 MIL/uL — ABNORMAL LOW (ref 3.87–5.11)
RDW: 12.8 % (ref 11.5–15.5)
WBC: 6.2 10*3/uL (ref 4.0–10.5)
nRBC: 0 % (ref 0.0–0.2)

## 2021-08-24 MED ORDER — HYDROXYZINE PAMOATE 25 MG PO CAPS: 25.0000 mg | ORAL_CAPSULE | Freq: Four times a day (QID) | ORAL | 3 refills | Status: DC | PRN

## 2021-08-24 MED ORDER — LACTATED RINGERS IV BOLUS
1000.0000 mL | Freq: Once | INTRAVENOUS | Status: AC
  Administered 2021-08-24: 1000 mL via INTRAVENOUS

## 2021-08-24 MED ORDER — FENTANYL CITRATE (PF) 100 MCG/2ML IJ SOLN
100.0000 ug | Freq: Once | INTRAMUSCULAR | Status: DC
  Filled 2021-08-24: qty 2

## 2021-08-24 MED ORDER — NIFEDIPINE 10 MG PO CAPS
10.0000 mg | ORAL_CAPSULE | Freq: Once | ORAL | Status: AC
  Administered 2021-08-24: 10 mg via ORAL
  Filled 2021-08-24: qty 1

## 2021-08-24 MED ORDER — HYDROXYZINE HCL 25 MG PO TABS
25.0000 mg | ORAL_TABLET | Freq: Once | ORAL | Status: AC
Start: 2021-08-24 — End: 2021-08-24
  Administered 2021-08-24: 25 mg via ORAL
  Filled 2021-08-24: qty 1

## 2021-08-24 NOTE — MAU Note (Signed)
.  Gloria Mann is a 31 y.o. at [redacted]w[redacted]d here in MAU reporting: started itching all over her body yesterday. Not on any meds. Has had cholestasis with both of her previous pregnancies. States she also started having right lower calf pain x2 days ago. Denies VB or LOF.+FM ? ?Pain score: 6 ? ?FHT:138 ?  ?

## 2021-08-24 NOTE — MAU Provider Note (Signed)
?History  ?  ? ?CSN: [id number] ? ?Arrival date and time: 08/24/21 0847 ? ? None  ?  ? ?Chief Complaint  ?Patient presents with  ? Pruritis  ? ?HPI ?This is a 31 year old G5 P0-2-2-2 at 35 weeks and 6 days who presents with history of itching.  She has a history of cholestasis during pregnancy.  Her itching feels like her normal symptoms of cholestasis.  No palliating or provoking factors. ? ?Since arriving, she does feel like she is having some contractions.  Initially they were pretty mild, but they have been increasing since she arrived.  Again, no feeling of provoking factors. ? ?OB History   ? ? Gravida  ?5  ? Para  ?2  ? Term  ?0  ? Preterm  ?2  ? AB  ?2  ? Living  ?2  ?  ? ? SAB  ?1  ? IAB  ?1  ? Ectopic  ?0  ? Multiple  ?   ? Live Births  ?2  ?   ?  ? Obstetric Comments  ?Daughter was born at 54 weeks pt had ICP ?Son was born at 35 weeks pt had ICP  ?Both born at [facility] in [place]   ?  ? ?  ? ? ?Past Medical History:  ?Diagnosis Date  ? Anxiety   ? Eczema   ? Headache   ? Herpes genitalis   ? Intrahepatic cholestasis of pregnancy   ? UTI (urinary tract infection)   ? ? ?Past Surgical History:  ?Procedure Laterality Date  ? NO PAST SURGERIES    ? ? ?Family History  ?Problem Relation Age of Onset  ? Depression Mother   ? Cancer Mother   ?     breast  ? High Cholesterol Mother   ? Thyroid disease Mother   ? Bipolar disorder Mother   ? Hyperlipidemia Father   ? Hypertension Father   ? Diabetes Paternal Aunt   ? Colon cancer Paternal Uncle   ? ? ?Social History  ? ?Tobacco Use  ? Smoking status: Never  ? Smokeless tobacco: Never  ?Vaping Use  ? Vaping Use: Never used  ?Substance Use Topics  ? Alcohol use: Not Currently  ? Drug use: Not Currently  ?  Types: Marijuana  ?  Comment: last time smoked 12/25/2020  ? ? ?Allergies:  ?Allergies  ?Allergen Reactions  ? Eggs Or Egg-Derived Products Rash  ?  Other reaction(s): Unknown  ? Morphine And Related   ?  Doesn't remember. ?Thinks she had itching and maybe her  lips swelled ?  ? ? ?Medications Prior to Admission  ?Medication Sig Dispense Refill Last Dose  ? cephALEXin (KEFLEX) 500 MG capsule Take 1 capsule (500 mg total) by mouth at bedtime. Start daily cephalexin (Keflex) medication AFTER you finish course of twice daily cefadroxil (Duricef). 30 capsule 2 08/23/2021  ? Prenatal Vit-Fe Fumarate-FA (PREPLUS) 27-1 MG TABS Take 1 tablet by mouth daily. 30 tablet 12 Past Week  ? cefadroxil (DURICEF) 500 MG capsule Take 1 capsule (500 mg total) by mouth 2 (two) times daily. 14 capsule 0   ? cyclobenzaprine (FLEXERIL) 10 MG tablet Take 1 tablet (10 mg total) by mouth 2 (two) times daily as needed for muscle spasms. (Patient not taking: Reported on 07/03/2021) 20 tablet 0   ? Elastic Bandages & Supports (COMFORT FIT MATERNITY SUPP MED) MISC 1 Units by Does not apply route daily. (Patient not taking: Reported on 07/03/2021) 1 each 0   ? ? ?  Review of Systems ?Physical Exam  ? ?Blood pressure 103/72, pulse (!) 107, temperature 98.1 ?F (36.7 ?C), temperature source Oral, resp. rate 14, last menstrual period 12/16/2020, SpO2 100 %. ? ?Physical Exam ?Vitals reviewed.  ?Constitutional:   ?   Appearance: Normal appearance.  ?Skin: ?   General: Skin is warm and dry.  ?   Capillary Refill: Capillary refill takes less than 2 seconds.  ?Neurological:  ?   General: No focal deficit present.  ?   Mental Status: She is alert.  ?Psychiatric:     ?   Mood and Affect: Mood normal.     ?   Behavior: Behavior normal.     ?   Thought Content: Thought content normal.  ? ?940 patient cervical exam 1 cm, thick, blottable. ? ?58 -patient rechecked due to increased contractions and discomfort, as well as history of fast labors ?Dilation: 1.5 ?Effacement (%): Thick ?Station: Ballotable ?Presentation: Vertex ?Exam by:: Dr Adrian Blackwater ? ?1220 ?Dilation: 1.5 ?Effacement (%): Thick ?Station: Ballotable ?Presentation: Vertex ?Exam by:: Dr. Adrian Blackwater ? ? ?MAU Course  ?Procedures ?NST:  ?Baseline: 140  ?Variability:  moderate ?Accelerations: ++  ?Decelerations: neg ?Contractions: q1-3 minutes ? ? ?MDM ? ? ?Assessment and Plan  ? ?1. Supervision of other normal pregnancy, antepartum   ?2. History of preterm delivery, currently pregnant   ?3. History of cholestasis during pregnancy   ?4. [redacted] weeks gestation of pregnancy   ?5. Itching   ?6. Elevated LFTs   ? ?Contractions spacing out ?LFTs are a little elevated - will need to watch closely and recheck later this week in office. ?Bile Acids pending ?Will send vistaril in to the pharmacy. ?Return precautions given. ? ?Levie Heritage ?08/24/2021, 9:44 AM  ?

## 2021-08-25 ENCOUNTER — Other Ambulatory Visit: Payer: Self-pay | Admitting: Family Medicine

## 2021-08-25 ENCOUNTER — Inpatient Hospital Stay (HOSPITAL_COMMUNITY)
Admission: AD | Admit: 2021-08-25 | Discharge: 2021-08-28 | DRG: 805 | Disposition: A | Discharge status: Home / Self Care | Payer: Medicaid Other | Attending: Obstetrics and Gynecology | Admitting: Obstetrics and Gynecology

## 2021-08-25 ENCOUNTER — Encounter (HOSPITAL_COMMUNITY): Payer: Self-pay | Admitting: Obstetrics and Gynecology

## 2021-08-25 ENCOUNTER — Other Ambulatory Visit: Payer: Self-pay

## 2021-08-25 DIAGNOSIS — O2662 Liver and biliary tract disorders in childbirth: Secondary | ICD-10-CM | POA: Diagnosis present

## 2021-08-25 DIAGNOSIS — O99824 Streptococcus B carrier state complicating childbirth: Secondary | ICD-10-CM | POA: Diagnosis present

## 2021-08-25 DIAGNOSIS — K831 Obstruction of bile duct: Secondary | ICD-10-CM | POA: Diagnosis present

## 2021-08-25 DIAGNOSIS — Z30017 Encounter for initial prescription of implantable subdermal contraceptive: Secondary | ICD-10-CM

## 2021-08-25 DIAGNOSIS — Z3480 Encounter for supervision of other normal pregnancy, unspecified trimester: Secondary | ICD-10-CM

## 2021-08-25 DIAGNOSIS — Z3A36 36 weeks gestation of pregnancy: Secondary | ICD-10-CM

## 2021-08-25 DIAGNOSIS — O09899 Supervision of other high risk pregnancies, unspecified trimester: Secondary | ICD-10-CM

## 2021-08-25 DIAGNOSIS — O9081 Anemia of the puerperium: Secondary | ICD-10-CM | POA: Diagnosis not present

## 2021-08-25 DIAGNOSIS — Z8719 Personal history of other diseases of the digestive system: Secondary | ICD-10-CM

## 2021-08-25 LAB — COMPREHENSIVE METABOLIC PANEL
ALT: 121 U/L — ABNORMAL HIGH (ref 0–44)
AST: 85 U/L — ABNORMAL HIGH (ref 15–41)
Albumin: 2.7 g/dL — ABNORMAL LOW (ref 3.5–5.0)
Alkaline Phosphatase: 189 U/L — ABNORMAL HIGH (ref 38–126)
Anion gap: 9 (ref 5–15)
BUN: 5 mg/dL — ABNORMAL LOW (ref 6–20)
CO2: 18 mmol/L — ABNORMAL LOW (ref 22–32)
Calcium: 8.8 mg/dL — ABNORMAL LOW (ref 8.9–10.3)
Chloride: 109 mmol/L (ref 98–111)
Creatinine, Ser: 0.72 mg/dL (ref 0.44–1.00)
GFR, Estimated: >60 mL/min (ref >60)
Glucose, Bld: 122 mg/dL — ABNORMAL HIGH (ref 70–99)
Potassium: 3.9 mmol/L (ref 3.5–5.1)
Sodium: 136 mmol/L (ref 135–145)
Total Bilirubin: 0.7 mg/dL (ref 0.3–1.2)
Total Protein: 6.5 g/dL (ref 6.5–8.1)

## 2021-08-25 LAB — CBC
HCT: 32.8 % — ABNORMAL LOW (ref 36.0–46.0)
Hemoglobin: 11.5 g/dL — ABNORMAL LOW (ref 12.0–15.0)
MCH: 32.4 pg (ref 26.0–34.0)
MCHC: 35.1 g/dL (ref 30.0–36.0)
MCV: 92.4 fL (ref 80.0–100.0)
Platelets: 179 10*3/uL (ref 150–400)
RBC: 3.55 MIL/uL — ABNORMAL LOW (ref 3.87–5.11)
RDW: 12.8 % (ref 11.5–15.5)
WBC: 8.2 10*3/uL (ref 4.0–10.5)
nRBC: 0 % (ref 0.0–0.2)

## 2021-08-25 LAB — TYPE AND SCREEN
ABO/RH(D): O POS
Antibody Screen: NEGATIVE

## 2021-08-25 LAB — BILE ACIDS, TOTAL: Bile Acids Total: 123.3 umol/L — ABNORMAL HIGH (ref 0.0–10.0)

## 2021-08-25 LAB — GROUP B STREP BY PCR: Group B strep by PCR: POSITIVE — AB

## 2021-08-25 MED ORDER — OXYTOCIN BOLUS FROM INFUSION
333.0000 mL | Freq: Once | INTRAVENOUS | Status: AC
  Administered 2021-08-26: 333 mL via INTRAVENOUS

## 2021-08-25 MED ORDER — FENTANYL CITRATE (PF) 100 MCG/2ML IJ SOLN
50.0000 ug | INTRAMUSCULAR | Status: DC | PRN
  Administered 2021-08-25: 100 ug via INTRAVENOUS
  Administered 2021-08-25: 50 ug via INTRAVENOUS
  Administered 2021-08-25: 100 ug via INTRAVENOUS
  Filled 2021-08-25 (×3): qty 2

## 2021-08-25 MED ORDER — TERBUTALINE SULFATE 1 MG/ML IJ SOLN: 0.2500 mg | Freq: Once | INTRAMUSCULAR | Status: DC | PRN

## 2021-08-25 MED ORDER — LACTATED RINGERS IV SOLN: 500.0000 mL | Freq: Once | INTRAVENOUS | Status: DC

## 2021-08-25 MED ORDER — ACETAMINOPHEN 325 MG PO TABS: 650.0000 mg | ORAL_TABLET | ORAL | Status: DC | PRN

## 2021-08-25 MED ORDER — PHENYLEPHRINE 80 MCG/ML (10ML) SYRINGE FOR IV PUSH (FOR BLOOD PRESSURE SUPPORT)
80.0000 ug | PREFILLED_SYRINGE | INTRAVENOUS | Status: DC | PRN
  Filled 2021-08-25: qty 10

## 2021-08-25 MED ORDER — PENICILLIN G POT IN DEXTROSE 60000 UNIT/ML IV SOLN
3.0000 10*6.[IU] | INTRAVENOUS | Status: DC
  Administered 2021-08-26 (×2): 3 10*6.[IU] via INTRAVENOUS
  Filled 2021-08-25 (×5): qty 50

## 2021-08-25 MED ORDER — SODIUM CHLORIDE 0.9 % IV SOLN
5.0000 10*6.[IU] | Freq: Once | INTRAVENOUS | Status: AC
  Administered 2021-08-25: 5 10*6.[IU] via INTRAVENOUS
  Filled 2021-08-25: qty 5

## 2021-08-25 MED ORDER — OXYCODONE-ACETAMINOPHEN 5-325 MG PO TABS: 2.0000 | ORAL_TABLET | ORAL | Status: DC | PRN

## 2021-08-25 MED ORDER — MISOPROSTOL 50MCG HALF TABLET
50.0000 ug | ORAL_TABLET | ORAL | Status: DC | PRN
  Administered 2021-08-26: 50 ug via BUCCAL
  Filled 2021-08-25: qty 1

## 2021-08-25 MED ORDER — FENTANYL-BUPIVACAINE-NACL 0.5-0.125-0.9 MG/250ML-% EP SOLN
12.0000 mL/h | EPIDURAL | Status: DC | PRN
  Filled 2021-08-25: qty 250

## 2021-08-25 MED ORDER — PENICILLIN G POT IN DEXTROSE 60000 UNIT/ML IV SOLN
3.0000 10*6.[IU] | Freq: Once | INTRAVENOUS | Status: AC
  Administered 2021-08-25: 3 10*6.[IU] via INTRAVENOUS
  Filled 2021-08-25: qty 50

## 2021-08-25 MED ORDER — DIPHENHYDRAMINE HCL 50 MG/ML IJ SOLN: 12.5000 mg | INTRAMUSCULAR | Status: DC | PRN

## 2021-08-25 MED ORDER — ONDANSETRON HCL 4 MG/2ML IJ SOLN
4.0000 mg | Freq: Four times a day (QID) | INTRAMUSCULAR | Status: DC | PRN
  Filled 2021-08-25: qty 2

## 2021-08-25 MED ORDER — OXYTOCIN-SODIUM CHLORIDE 30-0.9 UT/500ML-% IV SOLN: 2.5000 [IU]/h | INTRAVENOUS | Status: DC

## 2021-08-25 MED ORDER — SOD CITRATE-CITRIC ACID 500-334 MG/5ML PO SOLN: 30.0000 mL | ORAL | Status: DC | PRN

## 2021-08-25 MED ORDER — LIDOCAINE HCL (PF) 1 % IJ SOLN: 30.0000 mL | INTRAMUSCULAR | Status: DC | PRN

## 2021-08-25 MED ORDER — PENICILLIN G POT IN DEXTROSE 60000 UNIT/ML IV SOLN: 3.0000 10*6.[IU] | INTRAVENOUS | Status: DC

## 2021-08-25 MED ORDER — LACTATED RINGERS IV SOLN
500.0000 mL | INTRAVENOUS | Status: DC | PRN
  Administered 2021-08-26: 500 mL via INTRAVENOUS

## 2021-08-25 MED ORDER — URSODIOL 500 MG PO TABS: 500.0000 mg | ORAL_TABLET | Freq: Two times a day (BID) | ORAL | 0 refills | Status: DC

## 2021-08-25 MED ORDER — SODIUM CHLORIDE 0.9 % IV SOLN: 5.0000 10*6.[IU] | Freq: Once | INTRAVENOUS | Status: DC

## 2021-08-25 MED ORDER — EPHEDRINE 5 MG/ML INJ: 10.0000 mg | INTRAVENOUS | Status: DC | PRN

## 2021-08-25 MED ORDER — PHENYLEPHRINE 80 MCG/ML (10ML) SYRINGE FOR IV PUSH (FOR BLOOD PRESSURE SUPPORT): 80.0000 ug | PREFILLED_SYRINGE | INTRAVENOUS | Status: DC | PRN

## 2021-08-25 MED ORDER — OXYTOCIN-SODIUM CHLORIDE 30-0.9 UT/500ML-% IV SOLN
1.0000 m[IU]/min | INTRAVENOUS | Status: DC
  Administered 2021-08-26: 2 m[IU]/min via INTRAVENOUS
  Filled 2021-08-25: qty 500

## 2021-08-25 MED ORDER — LACTATED RINGERS IV SOLN: INTRAVENOUS | Status: DC

## 2021-08-25 MED ORDER — OXYCODONE-ACETAMINOPHEN 5-325 MG PO TABS: 1.0000 | ORAL_TABLET | ORAL | Status: DC | PRN

## 2021-08-25 NOTE — MAU Provider Note (Signed)
None  ?  ? ? ?S: Ms. Gloria Mann is a 31 y.o. [phone] at [redacted]w[redacted]d  who presents to MAU today complaining contractions q 10 minutes since yesterday. She denies vaginal bleeding. She denies LOF. She reports normal fetal movement.   ? ?O: BP 124/72   Pulse 83   Temp 98 ?F (36.7 ?C)   Resp 18   LMP 12/16/2020 (Exact Date)  ?GENERAL: Well-developed, well-nourished female in no acute distress.  ?HEAD: Normocephalic, atraumatic.  ?CHEST: Normal effort of breathing, regular heart rate ?ABDOMEN: Soft, nontender, gravid ? ?Cervical exam:  ?Dilation: 1.5 ?Effacement (%): Thick ?Cervical Position: Posterior ?Station: Ballotable ?Exam by:: K.Wilson,RN ? ? ?Fetal Monitoring: ?Baseline: 145 ?Variability: moderate ?Accelerations: 15x15 ?Decelerations: none ?Contractions: 5-10 ? ? ?A: ?1. Recurrent intrahepatic cholestasis of pregnancy   ?2. [redacted] weeks gestation of pregnancy   ? ?Consulted with Dr. Damita Dunnings given bile acids >100 and elevated liver enzymes- recommends admission for IOL ? ?P: ?-Admit to labor and delivery ?-Report called to Dr. Higinio Plan ? ?Wende Mott, CNM ?08/25/2021 5:52 PM ? ?

## 2021-08-25 NOTE — MAU Note (Signed)
.  Gloria Mann is a 31 y.o. at [redacted]w[redacted]d here in MAU reporting: pt was here yesterday for same pain in lower abd that  is constant and sharp. Still having ctx. On and off. Good fetal movement felt. And feels like she is leaking some clear fluid.  ? ?Onset of complaint: cont from yesterday  ?Pain score: 10 ?There were no vitals filed for this visit.   ?FHT:150 ?Lab orders placed from triage:  labor check ? ?

## 2021-08-25 NOTE — H&P (Signed)
LABOR AND DELIVERY ADMISSION HISTORY AND PHYSICAL NOTE ? ?Gloria Mann is a 31 y.o. female [phone] with IUP at [redacted]w[redacted]d by LMP presenting for IOL due to severe cholestasis. Diagnosed today with BA 123 and elevated LFTs. She also has been having lower sharp abdominal pain with contractions on and off.   ?She reports positive fetal movement. She denies vaginal bleeding. ? ?Prenatal History/Complications: ?PNC at [facility] initially but then transferred to [facility]  ?Pregnancy complications:  ?- Cholestasis diagnosis today, 5/16  ?- History of cholestasis in both previous pregnancies  ?- Pre-term deliveries x2 (33 and 35 weeks)  ? ?Past Medical History: ?Past Medical History:  ?Diagnosis Date  ? Anxiety   ? Eczema   ? Headache   ? Herpes genitalis   ? Intrahepatic cholestasis of pregnancy   ? UTI (urinary tract infection)   ? ? ?Past Surgical History: ?Past Surgical History:  ?Procedure Laterality Date  ? NO PAST SURGERIES    ? ? ?Obstetrical History: ?OB History   ? ? Gravida  ?5  ? Para  ?2  ? Term  ?0  ? Preterm  ?2  ? AB  ?2  ? Living  ?2  ?  ? ? SAB  ?1  ? IAB  ?1  ? Ectopic  ?0  ? Multiple  ?   ? Live Births  ?2  ?   ?  ? Obstetric Comments  ?Daughter was born at 73 weeks pt had ICP ?Son was born at 35 weeks pt had ICP  ?Both born at [facility] in [place]   ?  ? ?  ? ? ?Social History: ?Social History  ? ?Socioeconomic History  ? Marital status: Married  ?  Spouse name: Gery Pray  ? Number of children: 2  ? Years of education: Not on file  ? Highest education level: Associate degree: academic program  ?Occupational History  ? 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  Comment: Self Employed  ?Tobacco Use  ? Smoking status: Never  ? Smokeless tobacco: Never  ?Vaping Use  ? Vaping Use: Never used  ?Substance and Sexual Activity  ? Alcohol use: Not Currently  ? Drug use: Not Currently  ?  Types: Marijuana  ?  Comment: last time smoked 12/25/2020  ? Sexual activity: Yes  ?  Comment: last week  ?Other Topics Concern  ? Not on file  ?Social  History Narrative  ? ** Merged History Encounter **  ?    ? ?Social Determinants of Health  ? ?Financial Resource Strain: Not on file  ?Food Insecurity: Not on file  ?Transportation Needs: Not on file  ?Physical Activity: Not on file  ?Stress: Not on file  ?Social Connections: Not on file  ? ? ?Family History: ?Family History  ?Problem Relation Age of Onset  ? Depression Mother   ? Cancer Mother   ?     breast  ? High Cholesterol Mother   ? Thyroid disease Mother   ? Bipolar disorder Mother   ? Hyperlipidemia Father   ? Hypertension Father   ? Diabetes Paternal Aunt   ? Colon cancer Paternal Uncle   ? ? ?Allergies: ?Allergies  ?Allergen Reactions  ? Eggs Or Egg-Derived Products Rash  ?  Other reaction(s): Unknown  ? Morphine And Related   ?  Doesn't remember. ?Thinks she had itching and maybe her lips swelled ?  ? ? ?Medications Prior to Admission  ?Medication Sig Dispense Refill Last Dose  ? cephALEXin (KEFLEX) 500 MG capsule Take 1  capsule (500 mg total) by mouth at bedtime. Start daily cephalexin (Keflex) medication AFTER you finish course of twice daily cefadroxil (Duricef). 30 capsule 2   ? cyclobenzaprine (FLEXERIL) 10 MG tablet Take 1 tablet (10 mg total) by mouth 2 (two) times daily as needed for muscle spasms. (Patient not taking: Reported on 07/03/2021) 20 tablet 0   ? hydrOXYzine (VISTARIL) 25 MG capsule Take 1 capsule (25 mg total) by mouth every 6 (six) hours as needed for itching. 30 capsule 3   ? Prenatal Vit-Fe Fumarate-FA (PREPLUS) 27-1 MG TABS Take 1 tablet by mouth daily. 30 tablet 12   ? ursodiol (ACTIGALL) 500 MG tablet Take 1 tablet (500 mg total) by mouth 2 (two) times daily. 20 tablet 0   ? ? ? ?Review of Systems  ?All systems reviewed and negative except as stated in HPI ? ?Physical Exam ?Blood pressure 102/83, pulse 76, temperature 98.1 ?F (36.7 ?C), temperature source Oral, resp. rate 18, height 5\' 4"  (1.626 m), weight 74.4 kg, last menstrual period 12/16/2020, SpO2 99 %. ? ?General  appearance: alert, oriented, NAD ?Lungs: normal respiratory effort ?Heart: normal rate ?Abdomen: soft, non-tender; gravid  ?Extremities: no LE edema or calf tenderness to palpation  ? ?Presentation: Cephalic  ?Fetal monitoring: Baseline 135 bpm, moderate variability, + accels, no decels  ?Uterine activity: Occasional contractions  ? ?Dilation: 1.5 ?Effacement (%): Thick ?Station: Ballotable ?Exam by:: Dr. Mathis FareAlbert ? ?Prenatal labs: ?ABO, Rh: --/--/O POS (05/16 1745) ?Antibody: NEG (05/16 1745) ?Rubella: 3.86 (12/15 0900) ?RPR: Non Reactive (03/24 0932)  ?HBsAg: Negative (12/15 0900)  ?HIV: Non Reactive (03/24 0932)  ?GBS: POSITIVE/-- (05/16 1745) ?2-hr GTT: passed ?Genetic screening: LR female  ?Anatomy US: normal, EIF present  ? ?Prenatal Transfer Tool  ?Maternal Diabetes: No ?Genetic Screening: Normal ?Maternal Ultrasounds/Referrals: Normal ?Fetal Ultrasounds or other Referrals:  None ?Maternal Substance Abuse:  No ?Significant Maternal Medications:  Ursodiol  ?Significant Maternal Lab Results: Group B Strep positive ? ?Results for orders placed or performed during the hospital encounter of 08/25/21 (from the past 24 hour(s))  ?Group B strep by PCR  ? Collection Time: 08/25/21  5:45 PM  ? Specimen: Vaginal/Rectal; Genital  ?Result Value Ref Range  ? Group B strep by PCR POSITIVE (A) NEGATIVE  ?CBC  ? Collection Time: 08/25/21  5:45 PM  ?Result Value Ref Range  ? WBC 8.2 4.0 - 10.5 K/uL  ? RBC 3.55 (L) 3.87 - 5.11 MIL/uL  ? Hemoglobin 11.5 (L) 12.0 - 15.0 g/dL  ? HCT 32.8 (L) 36.0 - 46.0 %  ? MCV 92.4 80.0 - 100.0 fL  ? MCH 32.4 26.0 - 34.0 pg  ? MCHC 35.1 30.0 - 36.0 g/dL  ? RDW 12.8 11.5 - 15.5 %  ? Platelets 179 150 - 400 K/uL  ? nRBC 0.0 0.0 - 0.2 %  ?Comprehensive metabolic panel  ? Collection Time: 08/25/21  5:45 PM  ?Result Value Ref Range  ? Sodium 136 135 - 145 mmol/L  ? Potassium 3.9 3.5 - 5.1 mmol/L  ? Chloride 109 98 - 111 mmol/L  ? CO2 18 (L) 22 - 32 mmol/L  ? Glucose, Bld 122 (H) 70 - 99 mg/dL  ? BUN 5  (L) 6 - 20 mg/dL  ? Creatinine, Ser 0.72 0.44 - 1.00 mg/dL  ? Calcium 8.8 (L) 8.9 - 10.3 mg/dL  ? Total Protein 6.5 6.5 - 8.1 g/dL  ? Albumin 2.7 (L) 3.5 - 5.0 g/dL  ? AST 85 (H) 15 - 41 U/L  ?  ALT 121 (H) 0 - 44 U/L  ? Alkaline Phosphatase 189 (H) 38 - 126 U/L  ? Total Bilirubin 0.7 0.3 - 1.2 mg/dL  ? GFR, Estimated >60 >60 mL/min  ? Anion gap 9 5 - 15  ?Type and screen [facility]  ? Collection Time: 08/25/21  5:45 PM  ?Result Value Ref Range  ? ABO/RH(D) O POS   ? Antibody Screen NEG   ? Sample Expiration    ?  08/28/2021,2359 ?Performed at [facility], [address], [place], [place] [address] ?  ? ? ?Patient Active Problem List  ? Diagnosis Date Noted  ? Cholestasis of pregnancy 08/25/2021  ? Urinary tract infection in mother during third trimester of pregnancy 07/17/2021  ? Pruritus of pregnancy in third trimester 07/17/2021  ? Chest wall pain 05/21/2021  ? Pregnancy related fatigue in second trimester 05/21/2021  ? History of cholestasis during pregnancy 03/26/2021  ? Supervision of other normal pregnancy, antepartum 02/23/2021  ? History of preterm delivery, currently pregnant 02/23/2021  ? ? ?Assessment: ?Gloria Mann is a 31 y.o. [phone] at [redacted]w[redacted]d here for IOL due to cholestasis.  ? ?#Labor: Cervix similar to prior exam in MAU and very posterior. Will start induction with buccal Cytotec and reassess in 4 hours. Discussed foley balloon placement on next exam as needed. Will plan for early AROM when able, as she has progressed well after this in her prior labors.  ?#Pain: PRN, planning for epidural  ?#FWB: Cat I  ?#ID: GBS positive per PCR; PCN ordered  ?#MOF: Breast ?#MOC: Planning for partner vasectomy  ? ?#Cholestasis: BA 123 and AST/ALT 68/97 respectively on 5/15. Continue Ursodiol and symptomatic management. Repeat CMP on admit with AST/ALT 85/121. Will continue to monitor.  ? ?Worthy Rancher, MD ?08/25/2021, 11:59 PM  ?

## 2021-08-25 NOTE — Progress Notes (Signed)
Has ICP. Actigall called in. SHe's having sharp lower abdominal pain, so will be coming to hospital for evaluation.

## 2021-08-26 ENCOUNTER — Inpatient Hospital Stay (HOSPITAL_COMMUNITY): Payer: Medicaid Other | Admitting: Anesthesiology

## 2021-08-26 ENCOUNTER — Encounter (HOSPITAL_COMMUNITY): Payer: Self-pay | Admitting: Obstetrics and Gynecology

## 2021-08-26 DIAGNOSIS — Z3A36 36 weeks gestation of pregnancy: Secondary | ICD-10-CM

## 2021-08-26 DIAGNOSIS — O99824 Streptococcus B carrier state complicating childbirth: Secondary | ICD-10-CM

## 2021-08-26 DIAGNOSIS — O2662 Liver and biliary tract disorders in childbirth: Secondary | ICD-10-CM

## 2021-08-26 LAB — RPR: RPR Ser Ql: NONREACTIVE

## 2021-08-26 LAB — GC/CHLAMYDIA PROBE AMP (~~LOC~~) NOT AT ARMC
Chlamydia: NEGATIVE
Comment: NEGATIVE
Comment: NORMAL
Neisseria Gonorrhea: NEGATIVE

## 2021-08-26 MED ORDER — PRENATAL MULTIVITAMIN CH
1.0000 | ORAL_TABLET | Freq: Every day | ORAL | Status: DC
  Administered 2021-08-27 – 2021-08-28 (×2): 1 via ORAL
  Filled 2021-08-26 (×2): qty 1

## 2021-08-26 MED ORDER — ONDANSETRON 4 MG PO TBDP
ORAL_TABLET | ORAL | Status: AC
  Filled 2021-08-26: qty 1

## 2021-08-26 MED ORDER — SODIUM CHLORIDE 0.9% FLUSH: 3.0000 mL | INTRAVENOUS | Status: DC | PRN

## 2021-08-26 MED ORDER — COCONUT OIL OIL: 1.0000 "application " | TOPICAL_OIL | Status: DC | PRN

## 2021-08-26 MED ORDER — ONDANSETRON HCL 4 MG/2ML IJ SOLN: 4.0000 mg | INTRAMUSCULAR | Status: DC | PRN

## 2021-08-26 MED ORDER — ONDANSETRON HCL 4 MG PO TABS
4.0000 mg | ORAL_TABLET | ORAL | Status: DC | PRN
  Administered 2021-08-26: 4 mg via ORAL
  Filled 2021-08-26: qty 1

## 2021-08-26 MED ORDER — SIMETHICONE 80 MG PO CHEW
80.0000 mg | CHEWABLE_TABLET | ORAL | Status: DC | PRN
Start: 2021-08-26 — End: 2021-08-28

## 2021-08-26 MED ORDER — MEASLES, MUMPS & RUBELLA VAC IJ SOLR: 0.5000 mL | Freq: Once | INTRAMUSCULAR | Status: DC

## 2021-08-26 MED ORDER — SODIUM CHLORIDE 0.9 % IV SOLN: 250.0000 mL | INTRAVENOUS | Status: DC | PRN

## 2021-08-26 MED ORDER — BENZOCAINE-MENTHOL 20-0.5 % EX AERO: 1.0000 "application " | INHALATION_SPRAY | CUTANEOUS | Status: DC | PRN

## 2021-08-26 MED ORDER — DIBUCAINE (PERIANAL) 1 % EX OINT: 1.0000 "application " | TOPICAL_OINTMENT | CUTANEOUS | Status: DC | PRN

## 2021-08-26 MED ORDER — WITCH HAZEL-GLYCERIN EX PADS: 1.0000 "application " | MEDICATED_PAD | CUTANEOUS | Status: DC | PRN

## 2021-08-26 MED ORDER — IBUPROFEN 600 MG PO TABS
600.0000 mg | ORAL_TABLET | Freq: Four times a day (QID) | ORAL | Status: DC
  Administered 2021-08-26 – 2021-08-28 (×8): 600 mg via ORAL
  Filled 2021-08-26 (×8): qty 1

## 2021-08-26 MED ORDER — LIDOCAINE HCL (PF) 1 % IJ SOLN
INTRAMUSCULAR | Status: DC | PRN
  Administered 2021-08-26: 2 mL via EPIDURAL
  Administered 2021-08-26: 10 mL via EPIDURAL

## 2021-08-26 MED ORDER — ZOLPIDEM TARTRATE 5 MG PO TABS: 5.0000 mg | ORAL_TABLET | Freq: Every evening | ORAL | Status: DC | PRN

## 2021-08-26 MED ORDER — SENNOSIDES-DOCUSATE SODIUM 8.6-50 MG PO TABS
2.0000 | ORAL_TABLET | ORAL | Status: DC
  Administered 2021-08-27: 2 via ORAL
  Filled 2021-08-26: qty 2

## 2021-08-26 MED ORDER — FENTANYL-BUPIVACAINE-NACL 0.5-0.125-0.9 MG/250ML-% EP SOLN
EPIDURAL | Status: DC | PRN
  Administered 2021-08-26: 12 mL/h via EPIDURAL

## 2021-08-26 MED ORDER — DIPHENHYDRAMINE HCL 25 MG PO CAPS: 25.0000 mg | ORAL_CAPSULE | Freq: Four times a day (QID) | ORAL | Status: DC | PRN

## 2021-08-26 MED ORDER — TETANUS-DIPHTH-ACELL PERTUSSIS 5-2.5-18.5 LF-MCG/0.5 IM SUSY: 0.5000 mL | PREFILLED_SYRINGE | Freq: Once | INTRAMUSCULAR | Status: DC

## 2021-08-26 MED ORDER — ACETAMINOPHEN 325 MG PO TABS
650.0000 mg | ORAL_TABLET | ORAL | Status: DC | PRN
  Administered 2021-08-26: 650 mg via ORAL
  Filled 2021-08-26: qty 2

## 2021-08-26 MED ORDER — SODIUM CHLORIDE 0.9% FLUSH
3.0000 mL | Freq: Two times a day (BID) | INTRAVENOUS | Status: DC
  Administered 2021-08-26: 3 mL via INTRAVENOUS

## 2021-08-26 NOTE — Anesthesia Preprocedure Evaluation (Signed)
Anesthesia Evaluation  ?Patient identified by MRN, date of birth, ID band ?Patient awake ? ? ? ?Reviewed: ?Allergy & Precautions, Patient's Chart, lab work & pertinent test results ? ?Airway ?Mallampati: II ? ?TM Distance: >3 FB ?Neck ROM: Full ? ? ? Dental ?no notable dental hx. ? ?  ?Pulmonary ?neg pulmonary ROS,  ?  ?Pulmonary exam normal ?breath sounds clear to auscultation ? ? ? ? ? ? Cardiovascular ?negative cardio ROS ?Normal cardiovascular exam ?Rhythm:Regular Rate:Normal ? ? ?  ?Neuro/Psych ? Headaches, PSYCHIATRIC DISORDERS Anxiety   ? GI/Hepatic ?negative GI ROS, IOL for cholestasis of pregnancy ?  ?Endo/Other  ?negative endocrine ROS ? Renal/GU ?negative Renal ROS  ?negative genitourinary ?  ?Musculoskeletal ?negative musculoskeletal ROS ?(+)  ? Abdominal ?  ?Peds ?negative pediatric ROS ?(+)  Hematology ?negative hematology ROS ?(+) hct 32.8, plt 179   ?Anesthesia Other Findings ? ? Reproductive/Obstetrics ?(+) Pregnancy ?1 prior epidural  ? ?  ? ? ? ? ? ? ? ? ? ? ? ? ? ?  ?  ? ? ? ? ? ? ? ? ?Anesthesia Physical ?Anesthesia Plan ? ?ASA: 2 ? ?Anesthesia Plan: Epidural  ? ?Post-op Pain Management:   ? ?Induction:  ? ?PONV Risk Score and Plan: 2 ? ?Airway Management Planned: Natural Airway ? ?Additional Equipment: None ? ?Intra-op Plan:  ? ?Post-operative Plan:  ? ?Informed Consent: I have reviewed the patients History and Physical, chart, labs and discussed the procedure including the risks, benefits and alternatives for the proposed anesthesia with the patient or authorized representative who has indicated his/her understanding and acceptance.  ? ? ? ? ? ?Plan Discussed with:  ? ?Anesthesia Plan Comments:   ? ? ? ? ? ? ?Anesthesia Quick Evaluation ? ?

## 2021-08-26 NOTE — Anesthesia Procedure Notes (Signed)
Epidural ?Patient location during procedure: OB ?Start time: 08/26/2021 6:07 AM ?End time: 08/26/2021 6:17 AM ? ?Staffing ?Anesthesiologist: Lannie Fields, DO ?Performed: anesthesiologist  ? ?Preanesthetic Checklist ?Completed: patient identified, IV checked, risks and benefits discussed, monitors and equipment checked, pre-op evaluation and timeout performed ? ?Epidural ?Patient position: sitting ?Prep: DuraPrep and site prepped and draped ?Patient monitoring: continuous pulse ox, blood pressure, heart rate and cardiac monitor ?Approach: midline ?Location: L3-L4 ?Injection technique: LOR air ? ?Needle:  ?Needle type: Tuohy  ?Needle gauge: 17 G ?Needle length: 9 cm ?Needle insertion depth: 6 cm ?Catheter type: closed end flexible ?Catheter size: 19 Gauge ?Catheter at skin depth: 11 cm ?Test dose: negative ? ?Assessment ?Sensory level: T8 ?Events: blood not aspirated, injection not painful, no injection resistance, no paresthesia and negative IV test ? ?Additional Notes ?Patient identified. Risks/Benefits/Options discussed with patient including but not limited to bleeding, infection, nerve damage, paralysis, failed block, incomplete pain control, headache, blood pressure changes, nausea, vomiting, reactions to medication both or allergic, itching and postpartum back pain. Confirmed with bedside nurse the patient's most recent platelet count. Confirmed with patient that they are not currently taking any anticoagulation, have any bleeding history or any family history of bleeding disorders. Patient expressed understanding and wished to proceed. All questions were answered. Sterile technique was used throughout the entire procedure. Please see nursing notes for vital signs. Test dose was given through epidural catheter and negative prior to continuing to dose epidural or start infusion. Warning signs of high block given to the patient including shortness of breath, tingling/numbness in hands, complete motor  block, or any concerning symptoms with instructions to call for help. Patient was given instructions on fall risk and not to get out of bed. All questions and concerns addressed with instructions to call with any issues or inadequate analgesia.  Reason for block:procedure for pain ? ? ? ?

## 2021-08-26 NOTE — Progress Notes (Signed)
Post Partum Day 1  Subjective: Doing well. No acute events overnight. Pain is controlled and bleeding is appropriate. She is eating, drinking, voiding, and ambulating without issue. She is breast feeding which is going well. She has no other concerns at this time.  Objective: Blood pressure 108/68, pulse 65, temperature 98.1 F (36.7 C), temperature source Oral, resp. rate 16, height 5\' 4"  (1.626 m), weight 74.4 kg, last menstrual period 12/16/2020, SpO2 100 %, unknown if currently breastfeeding.  Physical Exam:  General: alert, cooperative, and no distress Lochia: appropriate Uterine Fundus: firm and below umbilicus  DVT Evaluation: no LE edema or calf tenderness to palpation   Recent Labs    08/25/21 1745 08/27/21 0457  HGB 11.5* 10.3*  HCT 32.8* 30.4*    Assessment/Plan: 08/29/21 Gloria Mann is a 31 y.o. 30 on PPD# 1 s/p SVD.  Progressing well. Meeting postpartum milestones. VSS. Continue routine postpartum care.  #Cholestasis: - Repeat LFTs this AM improving  - Will continue to monitor while inpatient   #Acute blood loss anemia:  - Hgb 11.5 > 10.3 postpartum - No symptoms  - Will continue prenatal vitamin with iron upon discharge   Feeding: Breast  Contraception: Nexplanon inpatient   Dispo: Plan for discharge on PPD#2.    LOS: 2 days   [id number], MD  08/27/2021, 9:11 AM

## 2021-08-26 NOTE — Progress Notes (Signed)
Patient desires permanent sterilization.  Other reversible forms of contraception were discussed with patient; she declines all other modalities. Risks of procedure discussed with patient including but not limited to: risk of regret, permanence of method, bleeding, infection, injury to surrounding organs and need for additional procedures.  Failure risk of 1-2 % with increased risk of ectopic gestation if pregnancy occurs was also discussed with patient.  Patient verbalized understanding of these risks and wants to proceed with sterilization.  Written informed consent obtained. I advised procedure today; patient declined, will schedule for tomorrow morning.  ? ?

## 2021-08-26 NOTE — Discharge Summary (Signed)
Postpartum Discharge Summary      Patient Name: Gloria Mann DOB: December 18, 1990 MRN: [id number]  Date of admission: 08/25/2021 Delivery date:08/26/2021  Delivering provider: Patriciaann Clan  Date of discharge: 08/28/2021  Admitting diagnosis: Cholestasis of pregnancy [O26.619, K83.1] Intrauterine pregnancy: [redacted]w[redacted]d    Secondary diagnosis:  Principal Problem:   Cholestasis of pregnancy Active Problems:   Supervision of other normal pregnancy, antepartum   History of preterm delivery, currently pregnant   History of cholestasis during pregnancy  Additional problems: none    Discharge diagnosis: Preterm Pregnancy Delivered                                              Post partum procedures:Nexplanon insertion Augmentation: AROM and Cytotec Complications: None  Hospital course: Induction of Labor With Vaginal Delivery   31y.o. yo [id number] [phone] [id number] admitted to the hospital 08/25/2021 for induction of labor.  Indication for induction: Cholestasis of pregnancy.  Patient had an uncomplicated labor course as follows: Membrane Rupture Time/Date: 6:50 AM ,08/26/2021   Delivery Method:Vaginal, Spontaneous  Episiotomy: None  Lacerations:  None  Details of delivery can be found in separate delivery note.  Patient had a routine postpartum course. Patient is discharged home 08/28/21.  Newborn Data: Birth date:08/26/2021  Birth time:11:17 AM  Gender:Female  Living status:Living  Apgars:7 ,9  Weight:2580 g   Magnesium Sulfate received: No BMZ received: No Rhophylac:No MMR:No T-DaP:Given prenatally Flu: No Transfusion:No  Physical exam  Vitals:   08/26/21 1823 08/26/21 2330 08/27/21 0258 08/27/21 1928  BP: 110/63 97/60 108/68 114/67  Pulse: 73 68 65 64  Resp: _0 Temp: 98.3 F (36.8 C) 98.2 F (36.8 C) 98.1 F (36.7 C) 98.1 F (36.7 C)  TempSrc: Oral Oral Oral Oral  SpO2: 100%     Weight:      Height:       General: alert, cooperative, and no distress Lochia:  appropriate Uterine Fundus: firm Incision: N/A DVT Evaluation: No evidence of DVT seen on physical exam. Negative Homan's sign. No cords or calf tenderness. Labs: Lab Results  Component Value Date   WBC 8.8 08/27/2021   HGB 10.3 (L) 08/27/2021   HCT 30.4 (L) 08/27/2021   MCV 91.8 08/27/2021   PLT 154 08/27/2021      Latest Ref Rng & Units 08/27/2021    4:57 AM  CMP  Total Protein 6.5 - 8.1 g/dL 5.9    Total Bilirubin 0.3 - 1.2 mg/dL 1.0    Alkaline Phos 38 - 126 U/L 185    AST 15 - 41 U/L 67    ALT 0 - 44 U/L 106     Edinburgh Score:    08/26/2021    1:45 PM  Edinburgh Postnatal Depression Scale Screening Tool  I have been able to laugh and see the funny side of things. 0  I have looked forward with enjoyment to things. 0  I have blamed myself unnecessarily when things went wrong. 0  I have been anxious or worried for no good reason. 2  I have felt scared or panicky for no good reason. 1  Things have been getting on top of me. 1  I have been so unhappy that I have had difficulty sleeping. 0  I have felt sad or miserable. 0  I have been so unhappy  that I have been crying. 0  The thought of harming myself has occurred to me. 0  Edinburgh Postnatal Depression Scale Total 4     After visit meds:  Allergies as of 08/28/2021       Reactions   Morphine And Related    Doesn't remember. Thinks she had itching and maybe her lips swelled        Medication List     STOP taking these medications    cephALEXin 500 MG capsule Commonly known as: KEFLEX   cyclobenzaprine 10 MG tablet Commonly known as: FLEXERIL   hydrOXYzine 25 MG capsule Commonly known as: Vistaril   multivitamin tablet   ursodiol 500 MG tablet Commonly known as: ACTIGALL       TAKE these medications    ibuprofen 600 MG tablet Commonly known as: ADVIL Take 1 tablet (600 mg total) by mouth every 6 (six) hours.   PrePLUS 27-1 MG Tabs Take 1 tablet by mouth daily.         Discharge  home in stable condition Infant Feeding: Breast Infant Disposition:home with mother Discharge instruction: per After Visit Summary and Postpartum booklet. Activity: Advance as tolerated. Pelvic rest for 6 weeks.  Diet: routine diet Future Appointments: Future Appointments  Date Time Provider [place]  10/05/2021  9:55 AM Aletha Halim, MD [facility]   Follow up Visit:  Message sent to [facility] by Dr Higinio Plan:  Please schedule this patient for a In person postpartum visit in 6 weeks with the following provider: Any provider. Additional Postpartum F/U:  hepatic function panel at postpartum visit    High risk pregnancy complicated by:  cholestasis of pregnancy  Delivery mode:  Vaginal, Spontaneous  Anticipated Birth Control:  Nexplanon inpt   08/28/2021 Christin Fudge, CNM

## 2021-08-26 NOTE — Progress Notes (Signed)
Labor Progress Note ?Gloria Mann is a 31 y.o. [phone] at [redacted]w[redacted]d who presented for IOL due to severe cholestasis ? ?S: Doing well. Comfortable with epidural. No concerns at this time.  ? ?O:  ?BP 93/62   Pulse 77   Temp 97.9 ?F (36.6 ?C) (Oral)   Resp 16   Ht 5\' 4"  (1.626 m)   Wt 74.4 kg   LMP 12/16/2020 (Exact Date)   SpO2 100%   BMI 28.15 kg/m?  ? ?EFM: Baseline 125 bpm, moderate variability, + accels, no decels  ?Toco: Every 1-3 minutes  ? ?CVE: Dilation: 3 ?Effacement (%): 50 ?Cervical Position: Posterior ?Station: -3 ?Presentation: Vertex ?Exam by:: [email or web address], MD ? ?[name]: 31 y.o. 37 [redacted]w[redacted]d  ? ?#Labor: Progressing well. AROM performed with clear fluid. Mom and baby tolerated this well. Contracting well every 1-3 minutes. Will manage expectantly for now. Can add Pitocin if contractions space.  ?#Pain: Epidural  ?#FWB: Cat 1  ?#GBS positive; receiving PCN ? ?[redacted]w[redacted]d, MD ?6:57 AM ? ?

## 2021-08-26 NOTE — Lactation Note (Signed)
This note was copied from a baby's chart. ?Lactation Consultation Note ? ?Patient Name: Gloria Mann ?Today's Date: 08/26/2021 ?Reason for consult: Initial assessment;Infant < 6lbs;Late-preterm 34-36.6wks ?Age:30 hours ? ?Mom attempted to feed baby but infant was sleepy.   LC reviewed hand expression.  33ml collected into a spoon and finger fed and spoon fed back to baby.   ?Infant cued and latched with LC assistance.  Football position used for good head support when latching. ?Infant has wide gape and several swallows heard during feeding.  Rhythmic sucking noted. ? ?LPTI guidelines reviewed and mom and dad understand to supplement after BF using spoon and mom is okay with bottle feeding her EBM as well. ?DEBP set up for mom.  Settings reviewed. ? ?Plan: ?STS, hand express, feed with cues.   ?PUmp every 3 hours to stimulate supply and collect milk to feed back to baby. ?Call for RN is unable to wake infant to feed.  ?Mom has resource sheet. ? ? ? ?Maternal Data ?Has patient been taught Hand Expression?: Yes ?Does the patient have breastfeeding experience prior to this delivery?: Yes ?How long did the patient breastfeed?: 11 months with her 30 yr old and 7 months with her 30 year old, 9 and 84 week gest. babies ? ?Feeding ?Mother's Current Feeding Choice: Breast Milk ? ?LATCH Score ?Latch: Grasps breast easily, tongue down, lips flanged, rhythmical sucking. ? ?Audible Swallowing: A few with stimulation ? ?Type of Nipple: Everted at rest and after stimulation ? ?Comfort (Breast/Nipple): Soft / non-tender ? ?Hold (Positioning): Assistance needed to correctly position infant at breast and maintain latch. ? ?LATCH Score: 8 ? ? ?Lactation Tools Discussed/Used ?Tools: Pump ?Breast pump type: Double-Electric Breast Pump ?Reason for Pumping: LPTI ?Pumping frequency: encouraged every 3 hours ? ?Interventions ?Interventions: Breast feeding basics reviewed;Assisted with latch;Skin to skin;Hand express;Adjust position;Support  pillows;Position options;Education;LC Services brochure;DEBP ? ?Discharge ?Pump: DEBP ?WIC Program: Yes ? ?Consult Status ?Consult Status: Follow-up ?Date: 08/27/21 ?Follow-up type: In-patient ? ? ? ?Gloria Mann ?08/26/2021, 4:49 PM ? ? ? ?

## 2021-08-27 ENCOUNTER — Encounter: Payer: Medicaid Other | Admitting: Family Medicine

## 2021-08-27 ENCOUNTER — Encounter (HOSPITAL_COMMUNITY)
Admission: AD | Disposition: A | Discharge status: Home / Self Care | Payer: Self-pay | Source: Home / Self Care | Attending: Obstetrics and Gynecology

## 2021-08-27 LAB — CBC
HCT: 30.4 % — ABNORMAL LOW (ref 36.0–46.0)
Hemoglobin: 10.3 g/dL — ABNORMAL LOW (ref 12.0–15.0)
MCH: 31.1 pg (ref 26.0–34.0)
MCHC: 33.9 g/dL (ref 30.0–36.0)
MCV: 91.8 fL (ref 80.0–100.0)
Platelets: 154 10*3/uL (ref 150–400)
RBC: 3.31 MIL/uL — ABNORMAL LOW (ref 3.87–5.11)
RDW: 12.9 % (ref 11.5–15.5)
WBC: 8.8 10*3/uL (ref 4.0–10.5)
nRBC: 0 % (ref 0.0–0.2)

## 2021-08-27 LAB — CULTURE, BETA STREP (GROUP B ONLY)

## 2021-08-27 LAB — HEPATIC FUNCTION PANEL
ALT: 106 U/L — ABNORMAL HIGH (ref 0–44)
AST: 67 U/L — ABNORMAL HIGH (ref 15–41)
Albumin: 2.4 g/dL — ABNORMAL LOW (ref 3.5–5.0)
Alkaline Phosphatase: 185 U/L — ABNORMAL HIGH (ref 38–126)
Bilirubin, Direct: 0.5 mg/dL — ABNORMAL HIGH (ref 0.0–0.2)
Indirect Bilirubin: 0.5 mg/dL (ref 0.3–0.9)
Total Bilirubin: 1 mg/dL (ref 0.3–1.2)
Total Protein: 5.9 g/dL — ABNORMAL LOW (ref 6.5–8.1)

## 2021-08-27 SURGERY — LIGATION, FALLOPIAN TUBE, POSTPARTUM
Anesthesia: Choice | Laterality: Left

## 2021-08-27 MED ORDER — DEXAMETHASONE SODIUM PHOSPHATE 4 MG/ML IJ SOLN
INTRAMUSCULAR | Status: AC
  Filled 2021-08-27: qty 1

## 2021-08-27 MED ORDER — ONDANSETRON HCL 4 MG/2ML IJ SOLN
INTRAMUSCULAR | Status: AC
  Filled 2021-08-27: qty 2

## 2021-08-27 MED ORDER — LIDOCAINE HCL 1 % IJ SOLN
0.0000 mL | Freq: Once | INTRAMUSCULAR | Status: AC | PRN
  Administered 2021-08-27: 20 mL via INTRADERMAL
  Filled 2021-08-27 (×2): qty 20

## 2021-08-27 MED ORDER — ETONOGESTREL 68 MG ~~LOC~~ IMPL
68.0000 mg | DRUG_IMPLANT | Freq: Once | SUBCUTANEOUS | Status: AC
  Administered 2021-08-27: 68 mg via SUBCUTANEOUS
  Filled 2021-08-27 (×2): qty 1

## 2021-08-27 NOTE — Anesthesia Postprocedure Evaluation (Signed)
Anesthesia Post Note  Patient: [place]  Procedure(s) Performed: AN AD HOC LABOR EPIDURAL     Patient location during evaluation: Mother Baby Anesthesia Type: Epidural Level of consciousness: awake, awake and alert and oriented Pain management: pain level controlled Vital Signs Assessment: post-procedure vital signs reviewed and stable Respiratory status: spontaneous breathing and respiratory function stable Cardiovascular status: blood pressure returned to baseline Postop Assessment: no headache, epidural receding, patient able to bend at knees, adequate PO intake, no backache, no apparent nausea or vomiting and able to ambulate Anesthetic complications: no   No notable events documented.  Last Vitals:  Vitals:   08/26/21 2330 08/27/21 0258  BP: 97/60 108/68  Pulse: 68 65  Resp: 18 16  Temp: 36.8 C 36.7 C  SpO2:      Last Pain:  Vitals:   08/27/21 0602  TempSrc:   PainSc: 8    Pain Goal:                   Cleda Clarks

## 2021-08-27 NOTE — Lactation Note (Signed)
This note was copied from a baby's chart. Lactation Consultation Note  Patient Name: Gloria Mann [id number] Date: 08/27/2021 Reason for consult: Follow-up assessment;Late-preterm 34-36.6wks Age:30 hours  Mom feels infant is feeding well and she is hearing swallows.  She has 5 ml of pumped milk in the refrigerator.  She last pumped last night.  She supplemented after a feeding last night, 54ml.   Mom states baby is set to eat around 3 pm.    LC encouraged her to feed now so a latch/feeding could be observed.    Mom attempted to latch in cradle.  Infant had multiple attempts but was unable to grasp the breast.  LC spoon fed 44ml to baby and provided pillows along with suggesting more support for baby's head when latching.    Infant latched in cross cradle.  Stimulation need to keep infant actively sucking.   LC encouraged mom to pump after this breastfeed and feed back milk to baby. LC discussed LPTI guidelines and supplementation with EBM, DBM or formula.   Plan: BF and pump after BF, giving back any milk collected from pumping/hand expression.  Plan discussed with RN.    Maternal Data    Feeding Mother's Current Feeding Choice: Breast Milk  LATCH Score Latch: Repeated attempts needed to sustain latch, nipple held in mouth throughout feeding, stimulation needed to elicit sucking reflex.  Audible Swallowing: A few with stimulation  Type of Nipple: Everted at rest and after stimulation  Comfort (Breast/Nipple): Soft / non-tender  Hold (Positioning): Assistance needed to correctly position infant at breast and maintain latch.  LATCH Score: 7   Lactation Tools Discussed/Used Tools: Pump Breast pump type: Double-Electric Breast Pump Pump Education: Setup, frequency, and cleaning Reason for Pumping: LPTI Pumping frequency: encouraged every 3 hours  Interventions Interventions: Breast feeding basics reviewed;Skin to skin;Breast massage;Position options;Support pillows;Adjust  position  Discharge Pump: DEBP  Consult Status Consult Status: Follow-up Date: 08/28/21 Follow-up type: In-patient    [facility] 08/27/2021, 3:22 PM

## 2021-08-27 NOTE — Procedures (Signed)

## 2021-08-27 NOTE — Social Work (Signed)
CSW received consult for hx of Anxiety.  CSW met with MOB to offer support and complete assessment.    CSW met with MOB at bedside and introduced CSW role. CSW observed MOB in bed and FOB holding the infant. MOB gave CSW permission to share all information with her spouse present. CSW inquired how MOB has felt since giving birth. MOB expressed feeling fine and shared the L&D went fine. CSW inquired how MOB felt emotionally during the pregnancy. MOB reported overall she felt fine. CSW inquired about MOB history of anxiety. MOB reported that she feels as though she has always had anxiety and was diagnosed during the pandemic. MOB reported that she was prescribed medication (cannot recall name) and took it 2-3 times but did not like the way it made her feel so stopped taking it. MOB reported at the beginning of the pregnancy she had therapy with [facility] counselor Roselyn Reef and felt it was very helpful. MOB shared that the counselor gave her another coping skill to use, "take an hour a day to write things down and not to allow the kids to interfere." MOB reported it has been helpful. MOB reported that she is open to reaching out to [place] if she feels it is necessary.  CSW discussed PPD/anxiety. MOB reported that was knowledgeable of PPD as she watched her own mother go through it. MOB reported no history of PPD/ anxiety. CSW provided education regarding the baby blues period vs. perinatal mood disorders, discussed treatment and gave resources for mental health follow up if concerns arise.  CSW recommended MOB complete a self-evaluation during the postpartum time period using the New Mom Checklist from Postpartum Progress and encouraged MOB to contact a medical professional if symptoms are noted at any time. CSW assessed MOB for additional need. MOB reported no further need. CSW assessed MOB for safety. MOB denied thoughts of harm to self and others.   CSW provided review of Sudden Infant Death Syndrome (SIDS)  precautions. MOB reported she has essential items for the infant and could use additional diapers. CSW informed MOB about [facility] services who had visited and left a pamphlet in the room. MOB reported she feels comfortable reaching out for their services. CSW inquired about food assistance. MOB reported no concerns with food. MOB reported she is not familiar with the [place] area since her family just moved from [place]. CSW provided MOB with a list of community resources. MOB was appreciative. CSW assessed MOB for additional need. MOB reported no further need.   CSW identifies no further need for intervention and no barriers to discharge at this time.   Kathrin Greathouse, MSW, LCSW Women's and [place] Worker  [phone] 2021/08/27  2:00 PM

## 2021-08-28 ENCOUNTER — Other Ambulatory Visit (HOSPITAL_COMMUNITY): Payer: Self-pay

## 2021-08-28 MED ORDER — IBUPROFEN 600 MG PO TABS
600.0000 mg | ORAL_TABLET | Freq: Four times a day (QID) | ORAL | 0 refills | Status: AC
  Filled 2021-08-28: qty 30, 8d supply, fill #0

## 2021-08-28 NOTE — Lactation Note (Signed)
This note was copied from a baby's chart. Lactation Consultation Note  Patient Name: Gloria Mann [id number] Date: 08/28/2021 Reason for consult: Follow-up assessment;Infant < 6lbs;Late-preterm 34-36.6wks Age:30 hours   P3 mother whose infant is now 68 hours old.  This is a later preterm baby at 36+1 weeks.  Mother's current feeding preference is breast and formula supplementation.  She breast fed her first child (now 74 years old) for 11 months and her second child (almost 43 years old) for 7 months.  Reviewed feeding plan for today to include: feeding at least every three hours or sooner if baby shows cues.  Mother will continue to use her own personal bottle/nipple from home.  Asked her to increase volumes to 20-30 mls every three hours.  Mother reported that she does not have difficulty with bottle feeding.  Encouraged her to latch prior to formula feeding and to spend no more than 5-10 minutes if baby shows no interest.  Mother stated that she feels her daughter latches well at this time.  Mother will continue to pump for 15 minutes and feed back any milk she obtains prior to giving formula supplementation.    Mother desires a discharge tomorrow and I encouraged to continue her current plan.  Baby is voiding/stooling.  Discussed pump options for discharge.  I will provide a [facility] loaner tomorrow until mother is able to pick up her pump from [facility] on Monday.  Mother will inform father to bring correct cash for pump rental.     Maternal Data Has patient been taught Hand Expression?: Yes Does the patient have breastfeeding experience prior to this delivery?: Yes How long did the patient breastfeed?: 11 months with her first child and 7 months with her second child  Feeding Mother's Current Feeding Choice: Breast Milk and Formula Nipple Type: Other (como tomo slow flow)  LATCH Score                    Lactation Tools Discussed/Used Tools: Shells;Pump;Flanges Flange Size:  24 Breast pump type: Double-Electric Breast Pump;Manual Reason for Pumping: Breast stimulation for supplementation for late preterm infant Pumping frequency: Every three hours  Interventions Interventions: Breast feeding basics reviewed;Education  Discharge Pump: DEBP;Manual;WIC Loaner (Discussed) WIC Program: Yes  Consult Status Consult Status: Follow-up Date: 08/29/21 Follow-up type: In-patient    Kindel Rochefort R Haeley [place] 08/28/2021, 11:35 AM

## 2021-08-29 ENCOUNTER — Ambulatory Visit: Payer: Self-pay

## 2021-08-29 NOTE — Lactation Note (Signed)
This note was copied from a baby's chart. Lactation Consultation Note  Patient Name: Gloria Mann JIRCV'E Date: 08/29/2021 Reason for consult: Follow-up assessment Age:30 hours  P3 mother whose infant is now 70 hours old.  This is a late preterm baby at 36+1 weeks with a CGA of 36+4 weeks.  Mother's current feeding preference is breast and formula supplementation.  Baby has been breast feeding and supplementing with appropriate volumes.  Last LATCH score was an 8; voiding/stooling.  Mother had no questions/concerns related to breast feeding or pumping.  She has been obtaining approximately 120 mls/session. Mother does not have an electric pump for home use and we discussed a [facility] loaner yesterday.  Today she informed me that her mother in law is going to be purchasing a pump today for her and declined the loaner pump.  Mother has our OP phone number for any concerns after discharge.   Maternal Data    Feeding Nipple Type: Other (como tomo slow flow)  LATCH Score                    Lactation Tools Discussed/Used    Interventions    Discharge Discharge Education: Engorgement and breast care Pump: Personal (Mother in law will purchase a pump today; mother declined a [facility] loaner)  Consult Status Consult Status: Complete Date: 08/29/21 Follow-up type: Call as needed    Dhamar Gregory R Bear Osten 08/29/2021, 11:21 AM

## 2021-09-03 ENCOUNTER — Telehealth (HOSPITAL_COMMUNITY): Payer: Self-pay | Admitting: *Deleted

## 2021-09-03 NOTE — Telephone Encounter (Signed)
Left phone voicemail message.  Odis Hollingshead, RN 09-03-2021 at 1:52pm

## 2021-10-05 ENCOUNTER — Encounter: Payer: Self-pay | Admitting: Obstetrics and Gynecology

## 2021-10-05 ENCOUNTER — Other Ambulatory Visit: Payer: Self-pay

## 2021-10-05 ENCOUNTER — Ambulatory Visit (INDEPENDENT_AMBULATORY_CARE_PROVIDER_SITE_OTHER): Payer: Medicaid Other | Admitting: Obstetrics and Gynecology

## 2021-10-05 DIAGNOSIS — R7401 Elevation of levels of liver transaminase levels: Secondary | ICD-10-CM

## 2021-10-05 DIAGNOSIS — A609 Anogenital herpesviral infection, unspecified: Secondary | ICD-10-CM | POA: Diagnosis not present

## 2021-10-05 MED ORDER — VALACYCLOVIR HCL 1 G PO TABS: 1000.0000 mg | ORAL_TABLET | Freq: Every day | ORAL | 1 refills | Status: AC

## 2021-10-05 MED ORDER — AZELEX 20 % EX CREA: TOPICAL_CREAM | Freq: Two times a day (BID) | CUTANEOUS | 1 refills | Status: AC

## 2021-10-06 LAB — COMPREHENSIVE METABOLIC PANEL
ALT: 27 IU/L (ref 0–32)
AST: 16 IU/L (ref 0–40)
Albumin/Globulin Ratio: 1.7 (ref 1.2–2.2)
Albumin: 4.7 g/dL (ref 3.9–5.0)
Alkaline Phosphatase: 105 IU/L (ref 44–121)
BUN/Creatinine Ratio: 13 (ref 9–23)
BUN: 12 mg/dL (ref 6–20)
Bilirubin Total: 0.4 mg/dL (ref 0.0–1.2)
CO2: 19 mmol/L — ABNORMAL LOW (ref 20–29)
Calcium: 9.5 mg/dL (ref 8.7–10.2)
Chloride: 105 mmol/L (ref 96–106)
Creatinine, Ser: 0.93 mg/dL (ref 0.57–1.00)
Globulin, Total: 2.7 g/dL (ref 1.5–4.5)
Glucose: 84 mg/dL (ref 70–99)
Potassium: 4 mmol/L (ref 3.5–5.2)
Sodium: 139 mmol/L (ref 134–144)
Total Protein: 7.4 g/dL (ref 6.0–8.5)
eGFR: 85 mL/min/{1.73_m2} (ref >59)

## 2021-10-20 ENCOUNTER — Other Ambulatory Visit: Payer: Self-pay

## 2021-10-20 MED ORDER — SODIUM SULFACETAMIDE WASH 10 % EX LIQD: CUTANEOUS | 2 refills | Status: DC

## 2021-10-20 NOTE — Progress Notes (Signed)
SODIUM SULFACETAMIDE WASH 10 % EX LIQD Received: Today [place] Bing, MD  Isabell Jarvis, RN Apply twice a day prn for acne. 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   You can send her in 3 months worth with one refill. Thanks!   Rx sent to patient pharmacy as above. Pt called and made aware of change in Rx.  Gloria Mann

## 2021-10-22 MED ORDER — SULFACETAMIDE SODIUM-SULFUR 9.8-4.8 % EX LIQD: CUTANEOUS | 5 refills | Status: DC

## 2021-10-22 NOTE — Progress Notes (Signed)
PA required brand Rx Plexion instead. Rx sent to pharmacy on file.  Dr Vergie Living has update staff message by V. Forrest, PharmD about Rx change.  Judeth Cornfield, RN

## 2021-10-22 NOTE — Addendum Note (Signed)
Addended by: Isabell Jarvis on: 10/22/2021 01:59 PM   Modules accepted: Orders

## 2021-10-29 NOTE — Addendum Note (Signed)
Addended by: Isabell Jarvis on: 10/29/2021 08:45 AM   Modules accepted: Orders

## 2022-03-31 ENCOUNTER — Other Ambulatory Visit: Payer: Self-pay

## 2022-03-31 ENCOUNTER — Emergency Department (HOSPITAL_COMMUNITY)
Admission: EM | Admit: 2022-03-31 | Discharge: 2022-03-31 | Discharge status: Against Medical Advice | Payer: Self-pay | Attending: Student | Admitting: Student

## 2022-03-31 ENCOUNTER — Emergency Department (HOSPITAL_COMMUNITY): Payer: Self-pay

## 2022-03-31 ENCOUNTER — Encounter (HOSPITAL_COMMUNITY): Payer: Self-pay

## 2022-03-31 DIAGNOSIS — J029 Acute pharyngitis, unspecified: Secondary | ICD-10-CM | POA: Insufficient documentation

## 2022-03-31 DIAGNOSIS — R102 Pelvic and perineal pain: Secondary | ICD-10-CM | POA: Insufficient documentation

## 2022-03-31 DIAGNOSIS — R112 Nausea with vomiting, unspecified: Secondary | ICD-10-CM | POA: Insufficient documentation

## 2022-03-31 DIAGNOSIS — R509 Fever, unspecified: Secondary | ICD-10-CM | POA: Insufficient documentation

## 2022-03-31 DIAGNOSIS — N898 Other specified noninflammatory disorders of vagina: Secondary | ICD-10-CM | POA: Insufficient documentation

## 2022-03-31 DIAGNOSIS — Z20822 Contact with and (suspected) exposure to covid-19: Secondary | ICD-10-CM | POA: Insufficient documentation

## 2022-03-31 DIAGNOSIS — N9489 Other specified conditions associated with female genital organs and menstrual cycle: Secondary | ICD-10-CM | POA: Insufficient documentation

## 2022-03-31 DIAGNOSIS — Z5321 Procedure and treatment not carried out due to patient leaving prior to being seen by health care provider: Secondary | ICD-10-CM | POA: Insufficient documentation

## 2022-03-31 LAB — COMPREHENSIVE METABOLIC PANEL
ALT: 110 U/L — ABNORMAL HIGH (ref 0–44)
AST: 41 U/L (ref 15–41)
Albumin: 4.8 g/dL (ref 3.5–5.0)
Alkaline Phosphatase: 123 U/L (ref 38–126)
Anion gap: 9 (ref 5–15)
BUN: 9 mg/dL (ref 6–20)
CO2: 23 mmol/L (ref 22–32)
Calcium: 9.6 mg/dL (ref 8.9–10.3)
Chloride: 107 mmol/L (ref 98–111)
Creatinine, Ser: 0.85 mg/dL (ref 0.44–1.00)
GFR, Estimated: >60 mL/min (ref >60)
Glucose, Bld: 83 mg/dL (ref 70–99)
Potassium: 3.6 mmol/L (ref 3.5–5.1)
Sodium: 139 mmol/L (ref 135–145)
Total Bilirubin: 0.6 mg/dL (ref 0.3–1.2)
Total Protein: 8.3 g/dL — ABNORMAL HIGH (ref 6.5–8.1)

## 2022-03-31 LAB — RESP PANEL BY RT-PCR (RSV, FLU A&B, COVID)  RVPGX2
Influenza A by PCR: NEGATIVE
Influenza B by PCR: NEGATIVE
Resp Syncytial Virus by PCR: NEGATIVE
SARS Coronavirus 2 by RT PCR: NEGATIVE

## 2022-03-31 LAB — CBC WITH DIFFERENTIAL/PLATELET
Abs Immature Granulocytes: 0.02 10*3/uL (ref 0.00–0.07)
Basophils Absolute: 0 10*3/uL (ref 0.0–0.1)
Basophils Relative: 0 %
Eosinophils Absolute: 0.1 10*3/uL (ref 0.0–0.5)
Eosinophils Relative: 1 %
HCT: 38.2 % (ref 36.0–46.0)
Hemoglobin: 13.4 g/dL (ref 12.0–15.0)
Immature Granulocytes: 0 %
Lymphocytes Relative: 26 %
Lymphs Abs: 1.8 10*3/uL (ref 0.7–4.0)
MCH: 32.5 pg (ref 26.0–34.0)
MCHC: 35.1 g/dL (ref 30.0–36.0)
MCV: 92.7 fL (ref 80.0–100.0)
Monocytes Absolute: 0.4 10*3/uL (ref 0.1–1.0)
Monocytes Relative: 6 %
Neutro Abs: 4.5 10*3/uL (ref 1.7–7.7)
Neutrophils Relative %: 67 %
Platelets: 262 10*3/uL (ref 150–400)
RBC: 4.12 MIL/uL (ref 3.87–5.11)
RDW: 12.5 % (ref 11.5–15.5)
WBC: 6.8 10*3/uL (ref 4.0–10.5)
nRBC: 0 % (ref 0.0–0.2)

## 2022-03-31 LAB — URINALYSIS, ROUTINE W REFLEX MICROSCOPIC
Bilirubin Urine: NEGATIVE
Glucose, UA: NEGATIVE mg/dL
Hgb urine dipstick: NEGATIVE
Ketones, ur: 20 mg/dL — AB
Leukocytes,Ua: NEGATIVE
Nitrite: POSITIVE — AB
Protein, ur: NEGATIVE mg/dL
Specific Gravity, Urine: 1.023 (ref 1.005–1.030)
pH: 6 (ref 5.0–8.0)

## 2022-03-31 LAB — I-STAT BETA HCG BLOOD, ED (MC, WL, AP ONLY): I-stat hCG, quantitative: <5 m[IU]/mL (ref <5)

## 2022-03-31 LAB — HIV ANTIBODY (ROUTINE TESTING W REFLEX): HIV Screen 4th Generation wRfx: NONREACTIVE

## 2022-03-31 LAB — GROUP A STREP BY PCR: Group A Strep by PCR: NOT DETECTED

## 2022-03-31 LAB — LIPASE, BLOOD: Lipase: 35 U/L (ref 11–51)

## 2022-03-31 MED ORDER — IBUPROFEN 400 MG PO TABS: 600.0000 mg | ORAL_TABLET | Freq: Once | ORAL | Status: DC

## 2022-03-31 MED ORDER — ONDANSETRON 4 MG PO TBDP
8.0000 mg | ORAL_TABLET | Freq: Once | ORAL | Status: AC
  Administered 2022-03-31: 8 mg via ORAL
  Filled 2022-03-31: qty 2

## 2022-03-31 MED ORDER — OXYCODONE-ACETAMINOPHEN 5-325 MG PO TABS
1.0000 | ORAL_TABLET | Freq: Once | ORAL | Status: AC
  Administered 2022-03-31: 1 via ORAL
  Filled 2022-03-31: qty 1

## 2022-03-31 NOTE — ED Notes (Signed)
The patient again informed this RN that she is not feeling any better. She is tearful. I informed her that I had told the PA already but there were no new orders. I told her that I would inform the PA again of her complaints.   This RN again informed [place], [place]. No new orders at this time.

## 2022-03-31 NOTE — ED Provider Triage Note (Signed)
Emergency Medicine Provider Triage Evaluation Note  Gloria Mann , a 31 y.o. female  was evaluated in triage.  Pt complains of Multiple complaints.  having pelvic pain which is bilateral but worse to the right adnexal area.  It has been constant but worsened today.  Associated with nausea, vomiting and vaginal discharge over the last few weeks.  She is also having fevers intermittently, sore throat.  History of 3 previous vaginal deliveries, ovarian cysts, no previous abdominal surgeries.   Review of Systems  Per HPI  Physical Exam  BP 111/71 (BP Location: Left Arm)   Pulse 66   Temp 98.9 F (37.2 C) (Oral)   Resp 15   Ht 5\' 3"  (1.6 m)   Wt 65.8 kg   LMP 01/13/2022   SpO2 98%   BMI 25.69 kg/m  Gen:   Awake, no distress. tearful Resp:  Normal effort  MSK:   Moves extremities without difficulty  Other:  Suprapubic and right inguinal area pain.  Abdomen is soft, there is some slight right lower quadrant pain but it seems to be lower and described as more internal than external when palpating  Medical Decision Making  Medically screening exam initiated at 2:15 PM.  Appropriate orders placed.  03/15/2022 [place] was informed that the remainder of the evaluation will be completed by another provider, this initial triage assessment does not replace that evaluation, and the importance of remaining in the ED until their evaluation is complete.  Abdominal labs, STD panel think so , it looks like crying Percocet also makes your heart rate go down of PID, viral panel.  Ordered ultrasound to evaluate for torsion given severity of pain, considered appendicitis but think less likely given overall constellation of symptoms.     [place], PA-C 03/31/22 1418

## 2022-03-31 NOTE — ED Triage Notes (Signed)
Pt has multiple complaints: Pelvic pain for several weeks with emesis and think white vaginal discharge Congestion, cough, sore throat and fever for the last few days.

## 2022-03-31 NOTE — ED Notes (Signed)
The patient informed this RN that the meds she was given is making her feel more nauseous and it is not helping. This RN told her that I would look in her chart as well as inform the PA that she Is not feeling any better.  This RN informed Theron Arista, [place]. No new orders at this time.

## 2022-03-31 NOTE — ED Notes (Signed)
Pt called for triage multiple times, pt still in peds

## 2022-04-01 LAB — RPR: RPR Ser Ql: NONREACTIVE

## 2023-03-03 IMAGING — US US OB < 14 WEEKS - US OB TV
1 series · 15 of 28 positions shown · non-contrast
Comparison: None.

CLINICAL DATA: Doll pelvic pain for 1 week

EXAM:
OBSTETRIC <14 WK US AND TRANSVAGINAL OB US
TECHNIQUE: Both transabdominal and transvaginal ultrasound examinations were
performed for complete evaluation of the gestation as well as the
maternal uterus, adnexal regions, and pelvic cul-de-sac.
Transvaginal technique was performed to assess early pregnancy.

[Series 1: us ob < 14 weeks - us ob tv · 15 of 69 slices shown]
[im 1/69]
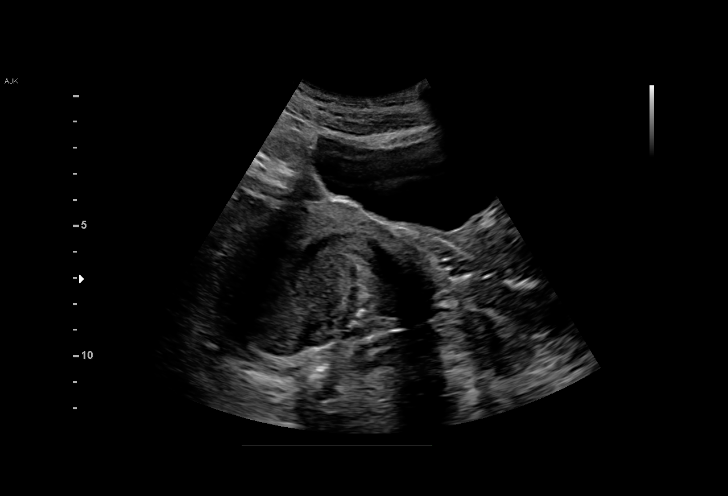
[im 6/69]
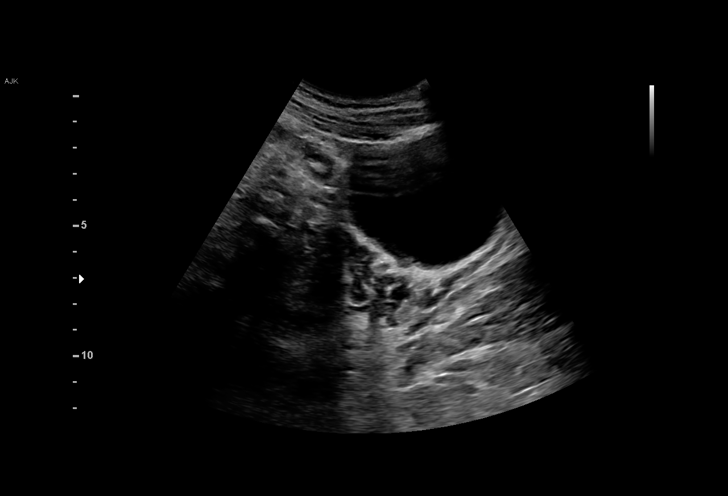
[im 11/69]
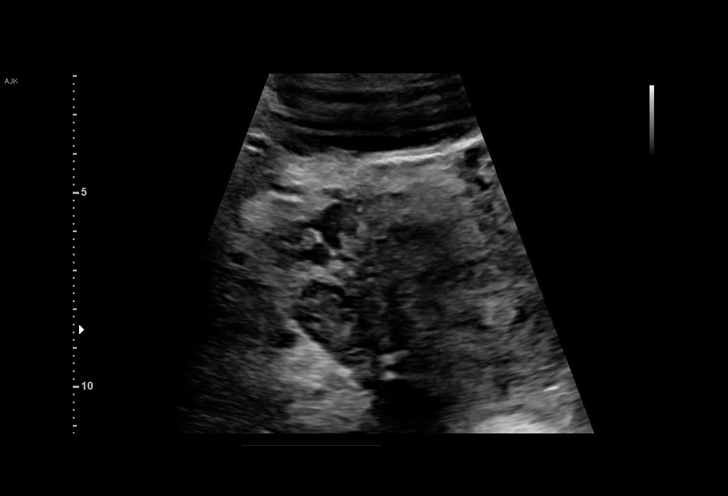
[im 16/69]
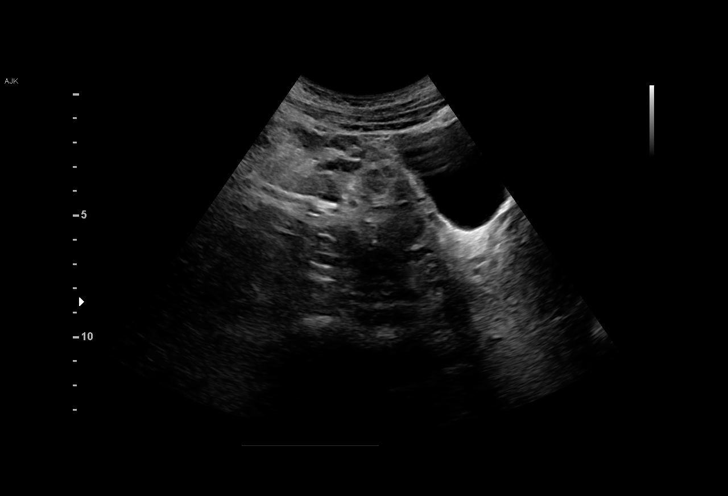
[im 21/69]
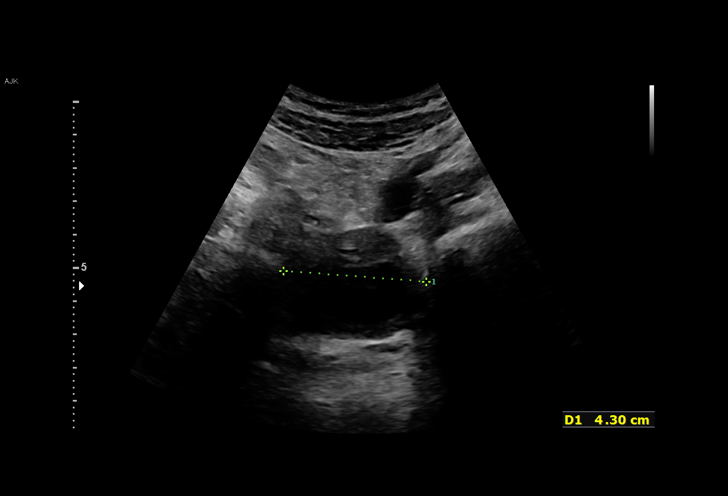
[im 26/69]
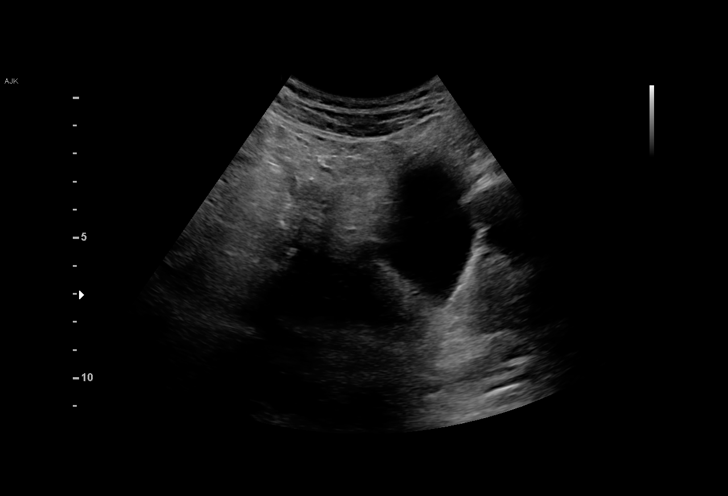
[im 31/69]
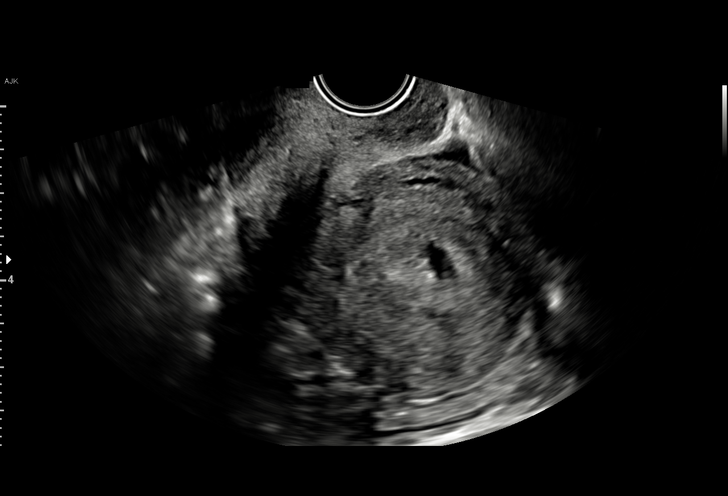
[im 36/69]
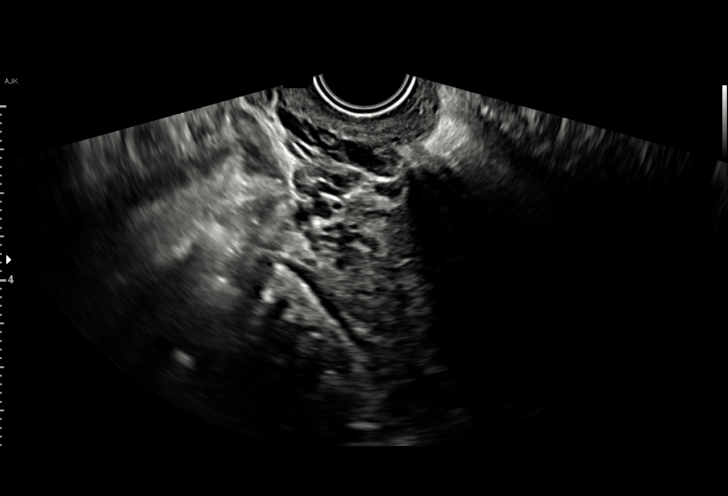
[im 38/69]
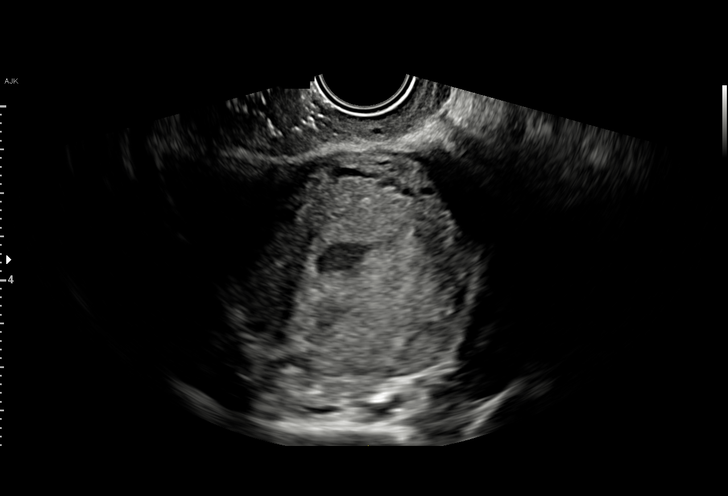
[im 43/69]
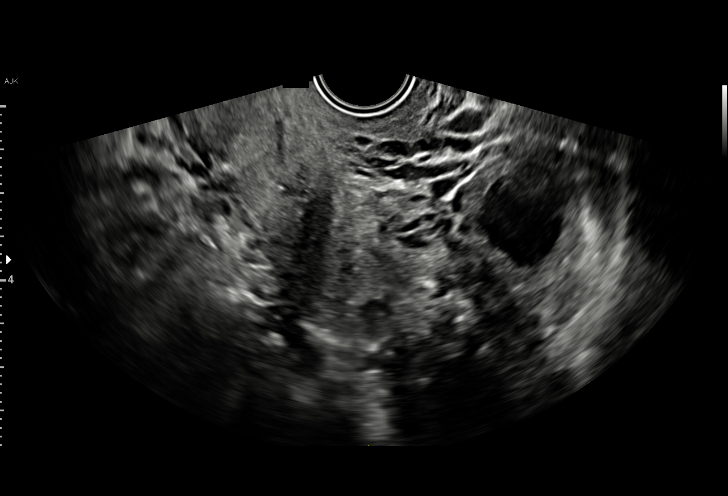
[im 48/69]
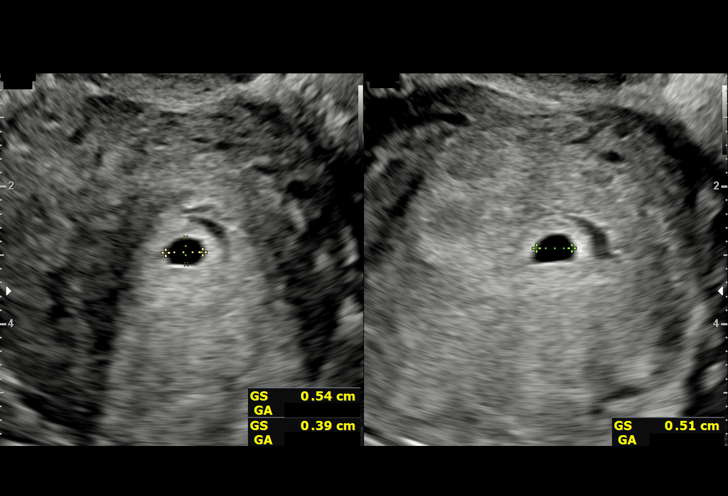
[im 53/69]
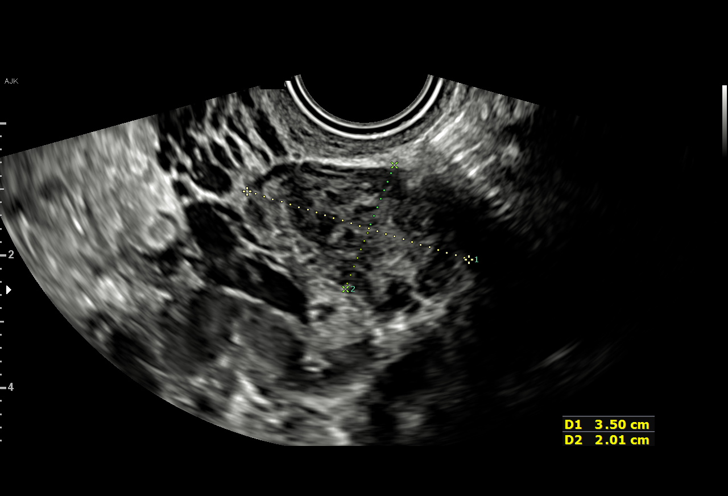
[im 58/69]
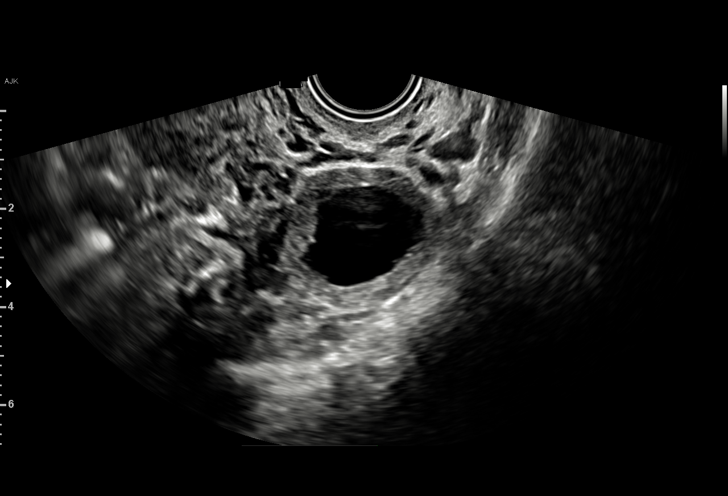
[im 63/69]
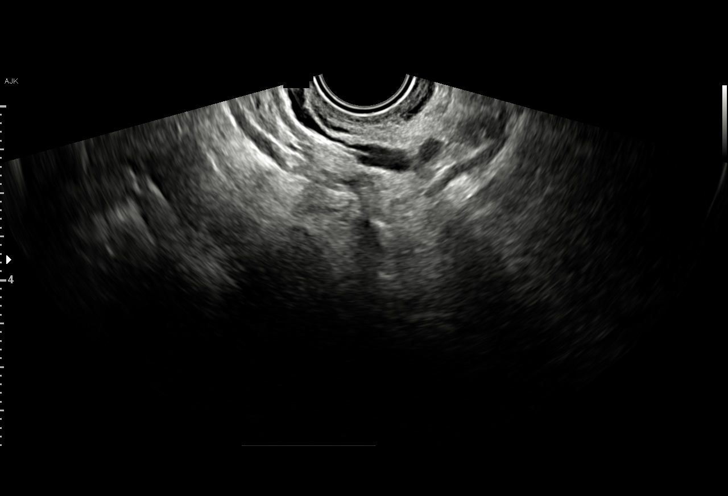
[im 69/69]
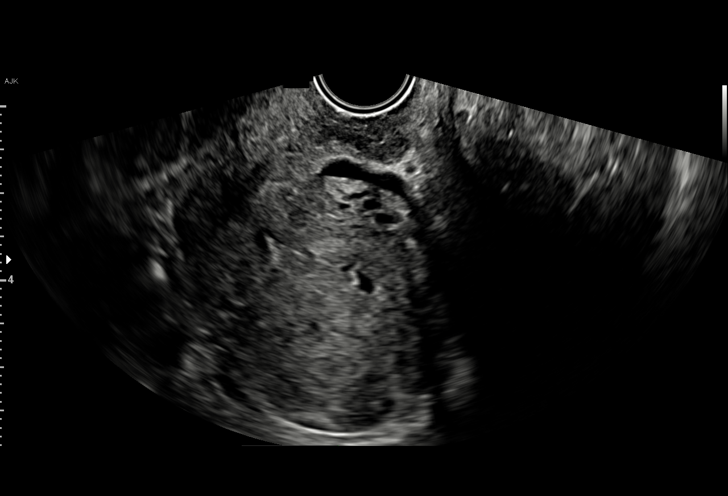

[15 of 28 positions shown; findings below may reference images not displayed]

FINDINGS: Intrauterine gestational sac: Single

Yolk sac:  Not Visualized.

Embryo:  Not Visualized.

Cardiac Activity: Not Visualized.

MSD: 4.8 mm   5 w   1 d

Subchorionic hemorrhage: Moderate subchorionic hemorrhage measuring
2.1 x 1.0 x 0.6 cm.

Maternal uterus/adnexae: Right ovary measures 2.5 x 3.5 x 2.0 cm and
the left ovary measures 3.2 x 4.0 x 3.1 cm. Corpus luteum cyst left
ovary. Trace pelvic free fluid.
IMPRESSION: 1. Probable early intrauterine gestational sac, but no yolk sac,
fetal pole, or cardiac activity yet visualized. Recommend follow-up
quantitative B-HCG levels and follow-up US in 14 days to assess
viability. This recommendation follows SRU consensus guidelines:
Diagnostic Criteria for Nonviable Pregnancy Early in the First
Trimester. N Engl J Med 2163; [DATE].
2. Moderate subchorionic hemorrhage.

## 2023-03-17 IMAGING — US US OB TRANSVAGINAL
1 series · 15 of 28 positions shown · non-contrast
Comparison: January 21, 2021.

CLINICAL DATA: Abdominal cramping, first trimester of pregnancy.

EXAM:
TRANSVAGINAL OB ULTRASOUND
TECHNIQUE: Transvaginal ultrasound was performed for complete evaluation of the
gestation as well as the maternal uterus, adnexal regions, and
pelvic cul-de-sac.

[Series 1: us ob transvaginal · 38 acquisitions, 15 frames shown]
[im 1/38]
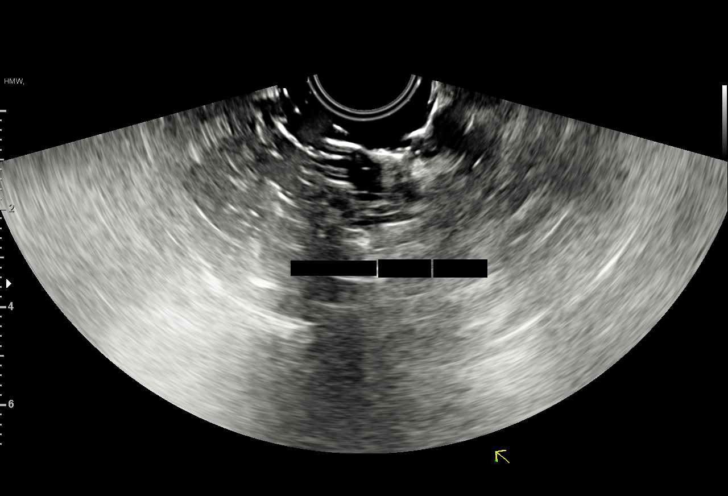
[im 3/38]
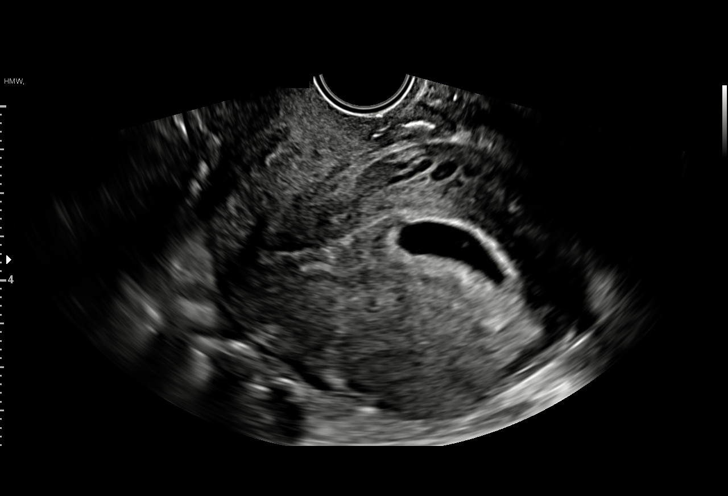
[im 6/38]
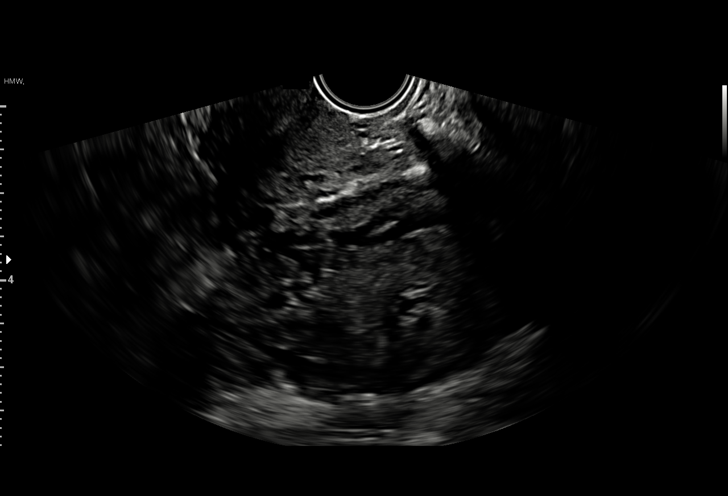
[im 9/38]
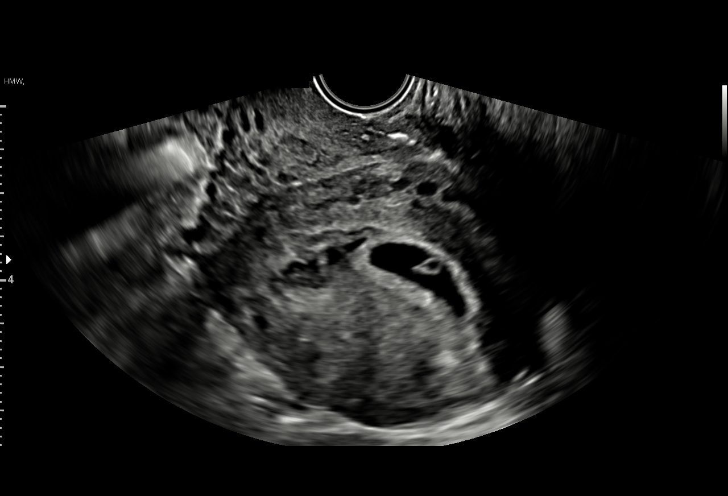
[im 11/38]
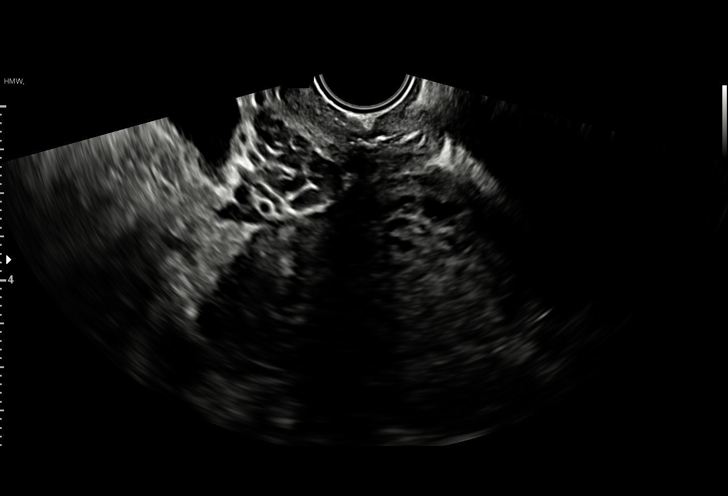
[im 14/38]
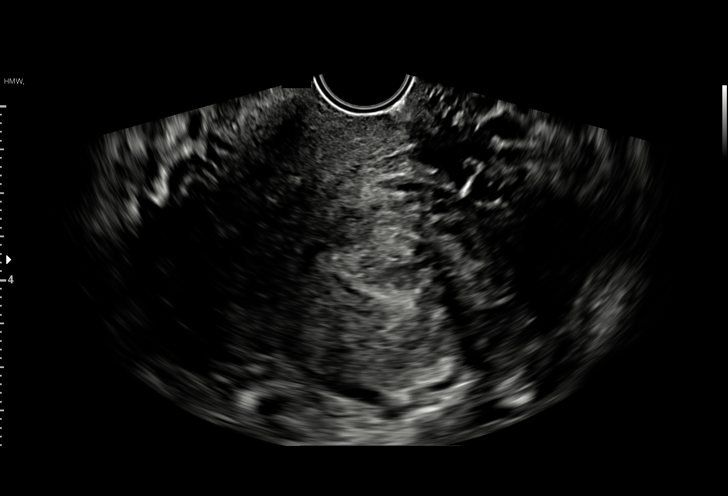
[im 17/38]
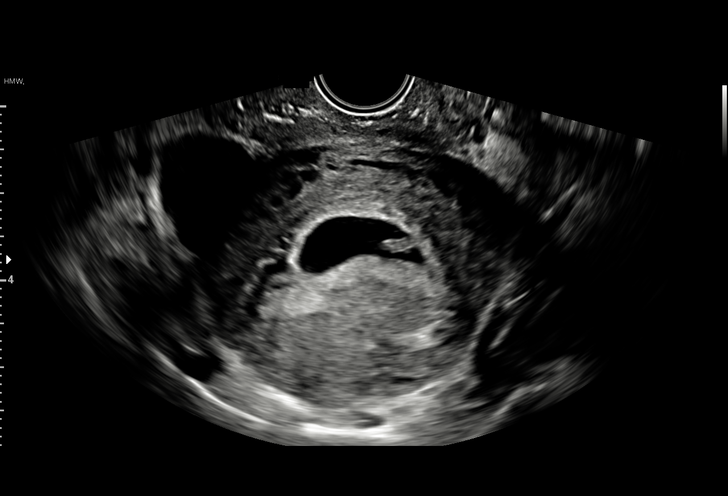
[im 20/38]
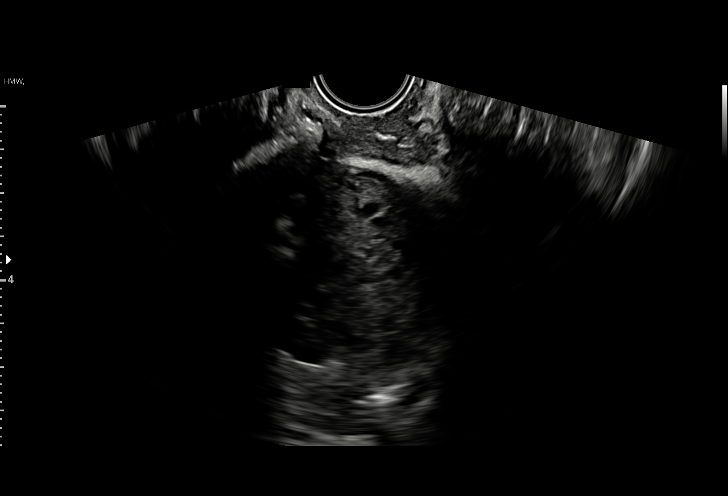
[im 21/38]
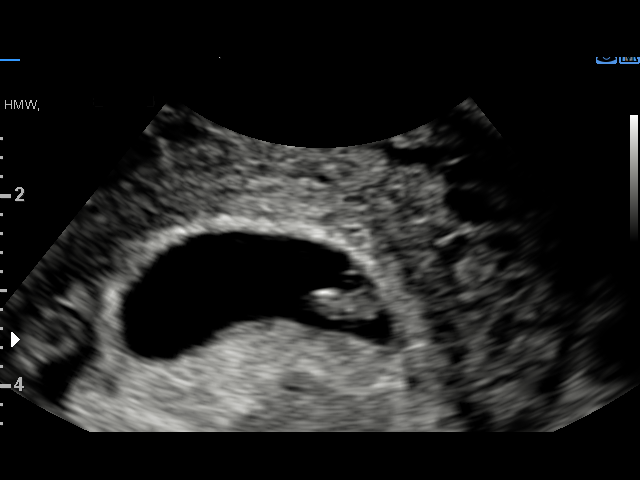
[im 24/38]
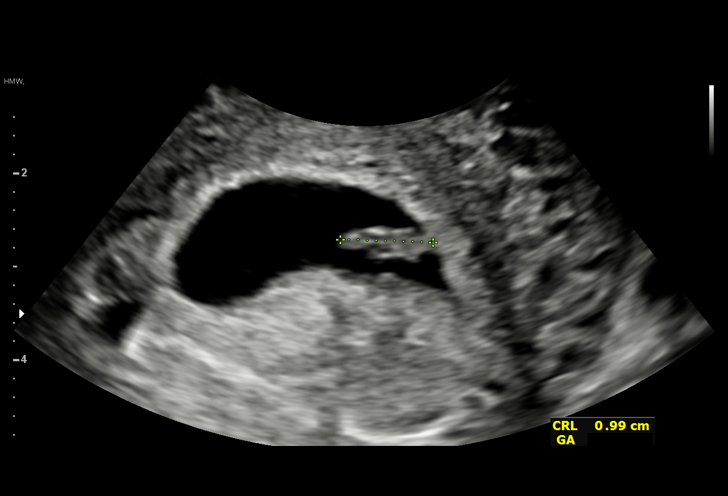
[im 27/38]
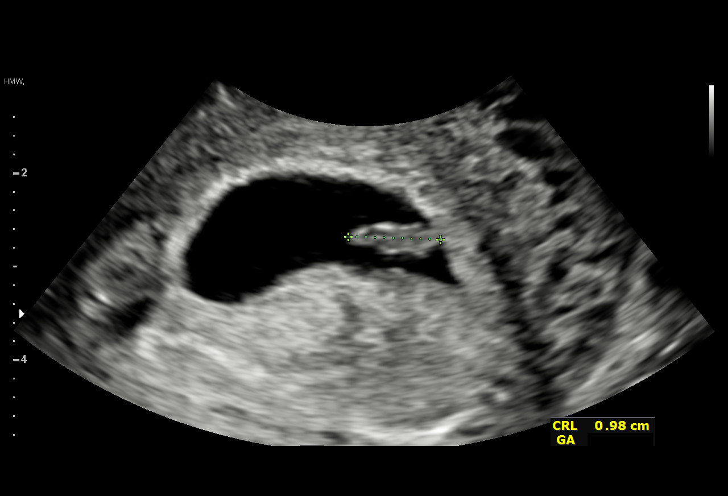
[im 29/38]
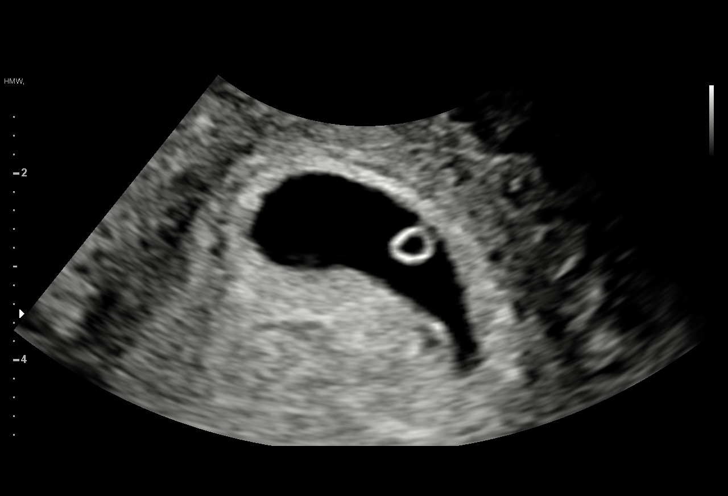
[im 32/38]
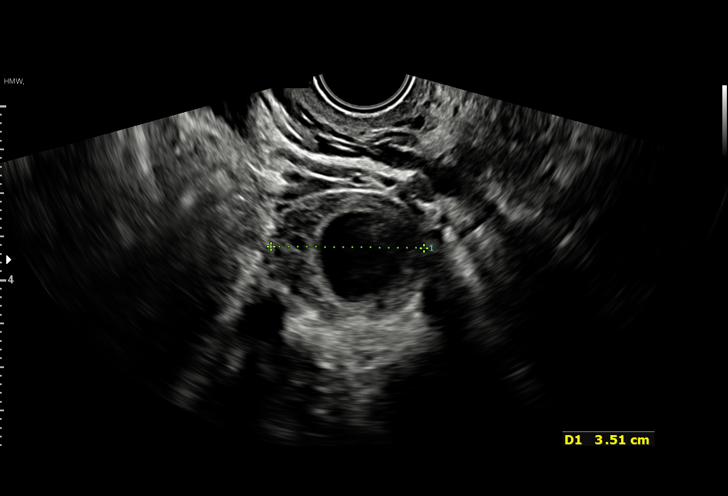
[im 35/38]
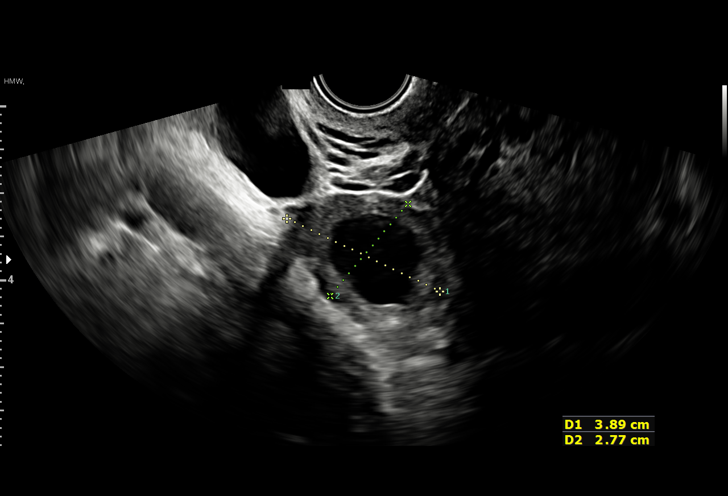
[im 38/38]
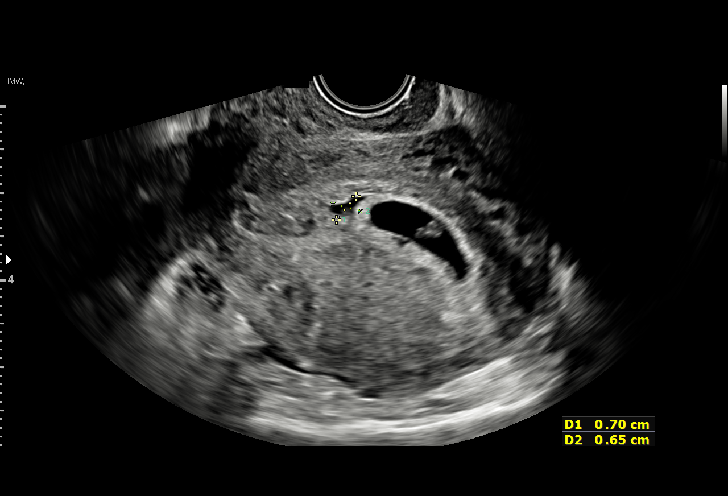

[15 of 28 positions shown; findings below may reference images not displayed]

FINDINGS: Intrauterine gestational sac: Single

Yolk sac:  Visualized.

Embryo:  Visualized.

Cardiac Activity: Visualized.

Heart Rate: 129 bpm

CRL:   9.9 mm   7 w 0 d                  US EDC: September 23, 2021.

Subchorionic hemorrhage:  Small subchorionic hemorrhage is noted.

Maternal uterus/adnexae: Right ovary is not visualized. Probable
corpus luteum cyst seen in left ovary. No free fluid is noted.
IMPRESSION: Single live intrauterine gestation of 7 weeks 0 days. Small
subchorionic hemorrhage is noted.

## 2023-06-30 IMAGING — DX DG CHEST 1V PORT
1 series · 1 of 1 positions shown · non-contrast
Comparison: None.

CLINICAL DATA: Shortness of breath and chest pain.

EXAM:
PORTABLE CHEST 1 VIEW

[chest]
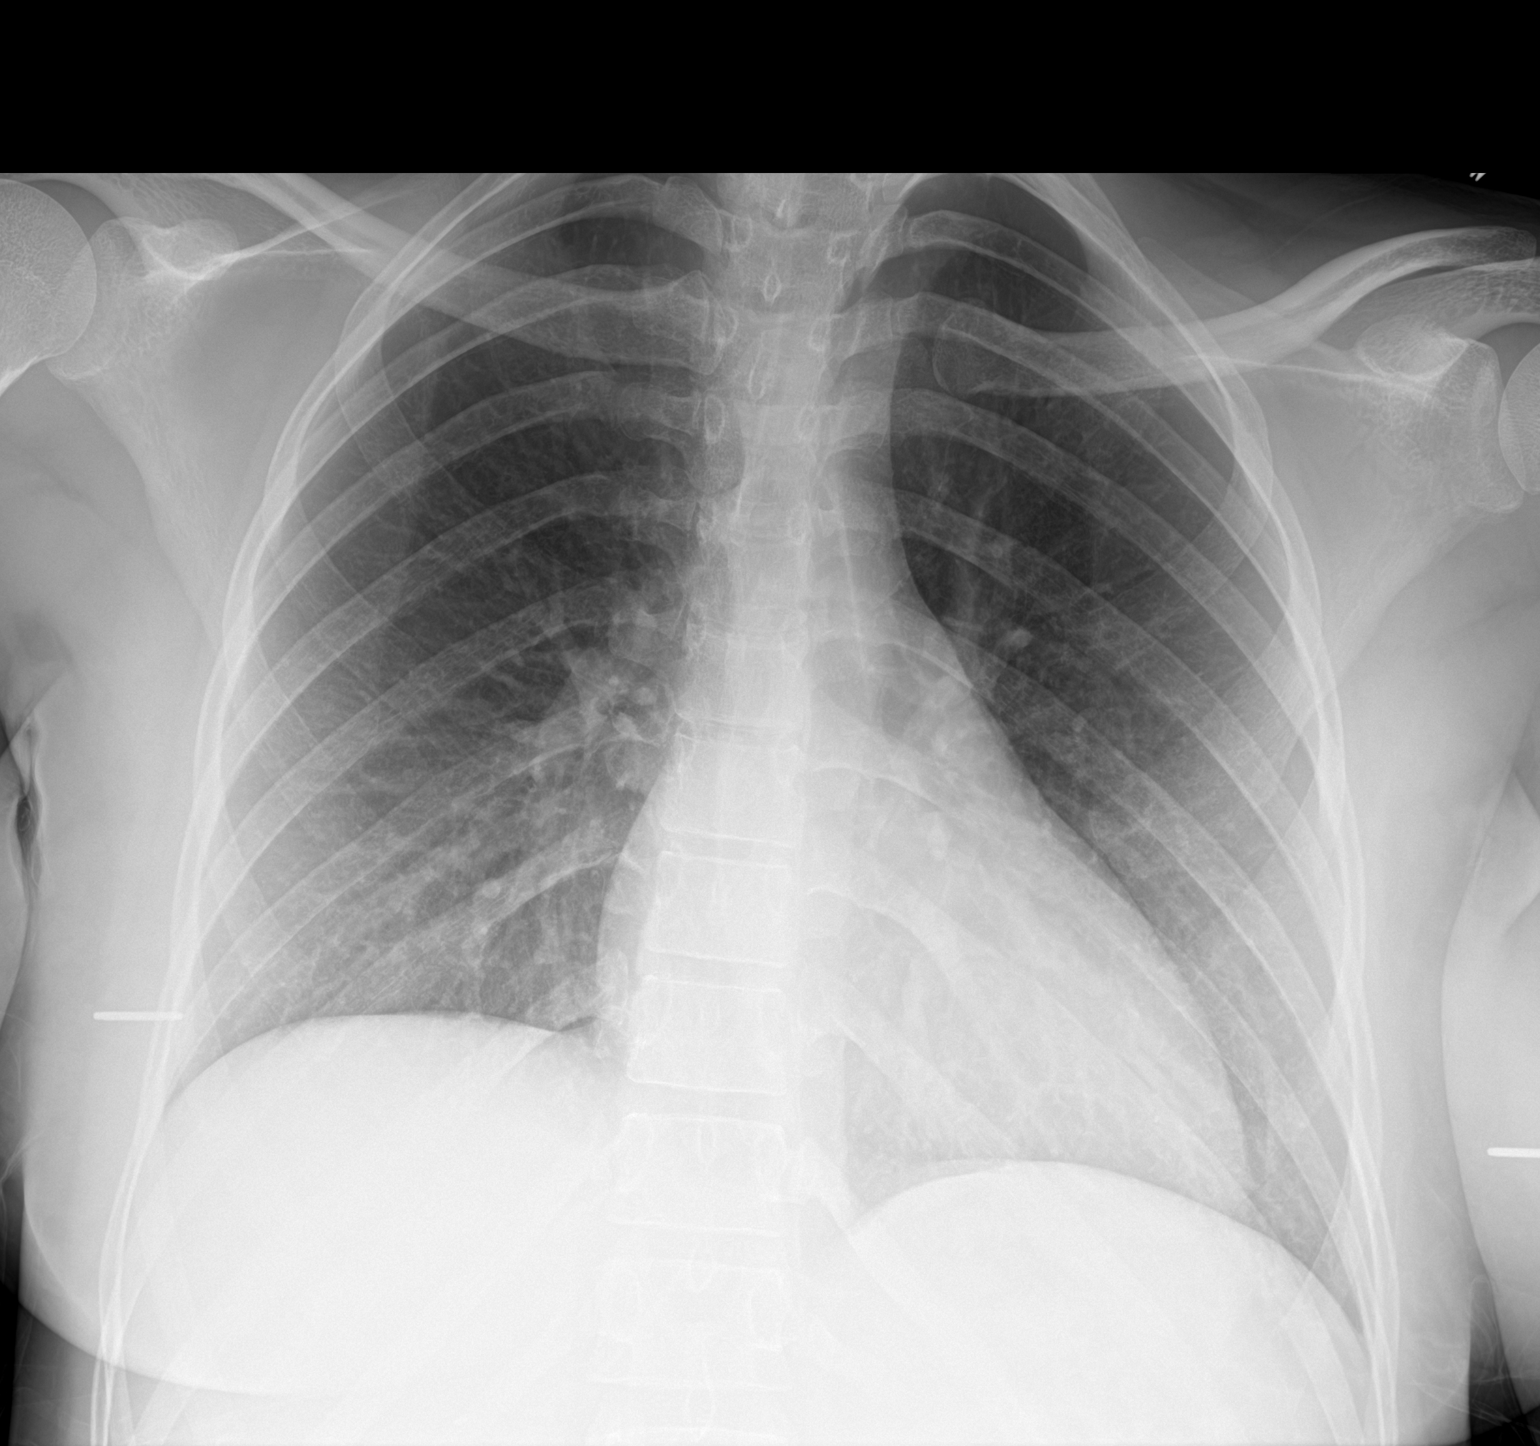

[1 of 1 positions shown; findings below may reference images not displayed]

FINDINGS: The heart size and mediastinal contours are within normal limits.
Both lungs are clear, with mildly elevated right hemidiaphragm. The
visualized skeletal structures are unremarkable.
IMPRESSION: No active disease.

## 2023-08-27 IMAGING — US US MFM OB LIMITED
1 series · 15 of 28 positions shown · non-contrast
Comparison: none

[Series 1: us mfm ob limited · 15 of 46 slices shown]
[im 1/46]
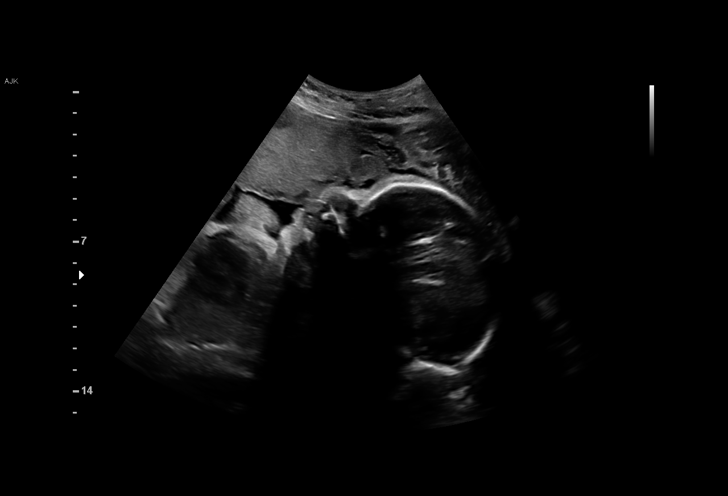
[im 4/46]
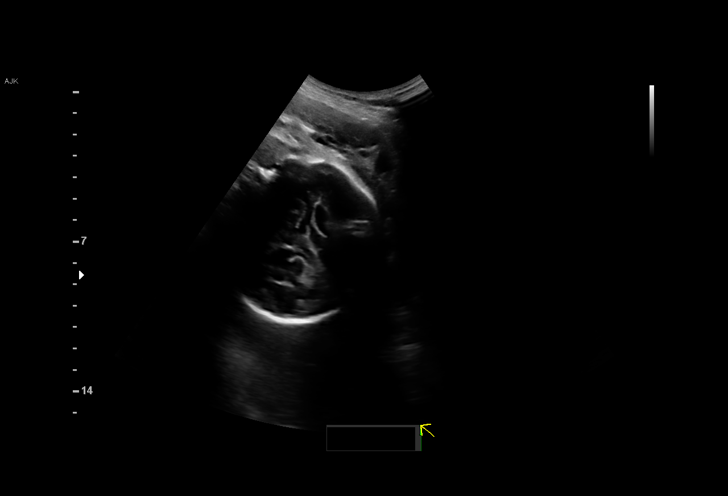
[im 7/46]
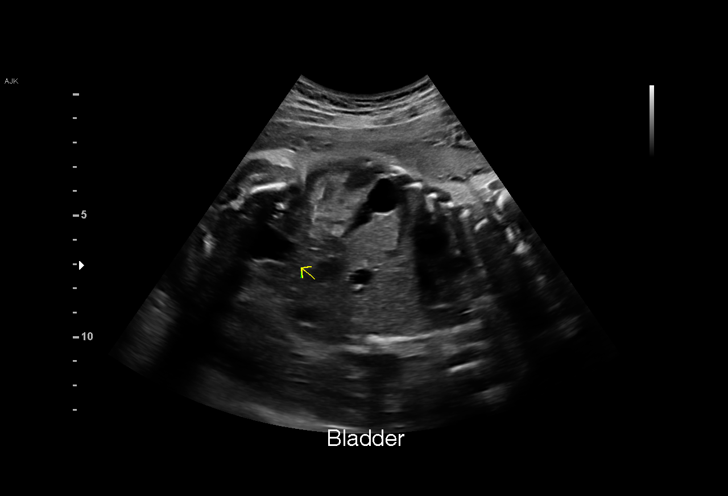
[im 11/46]
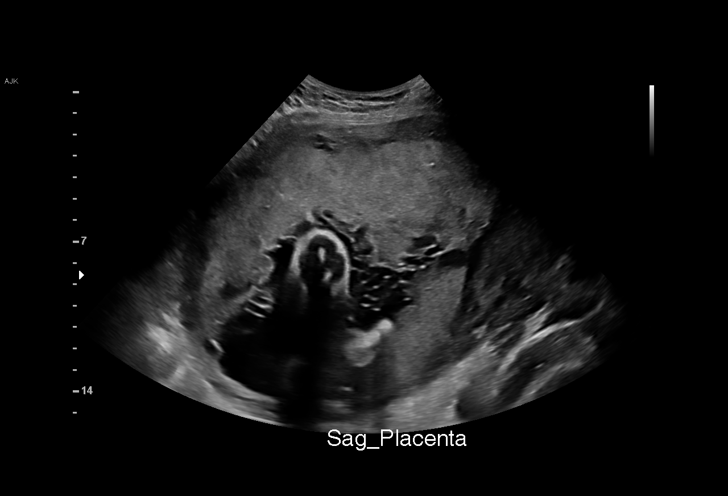
[im 14/46]
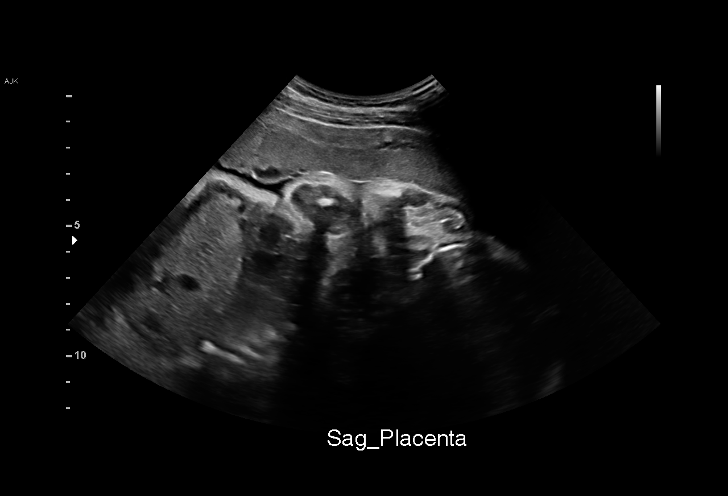
[im 17/46]
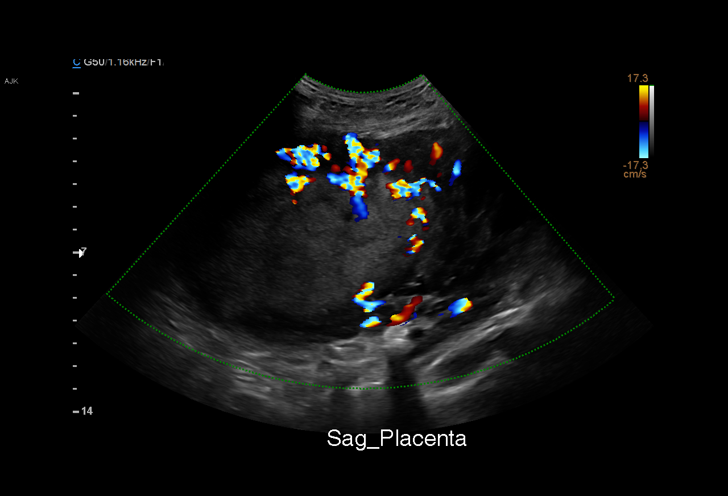
[im 21/46]
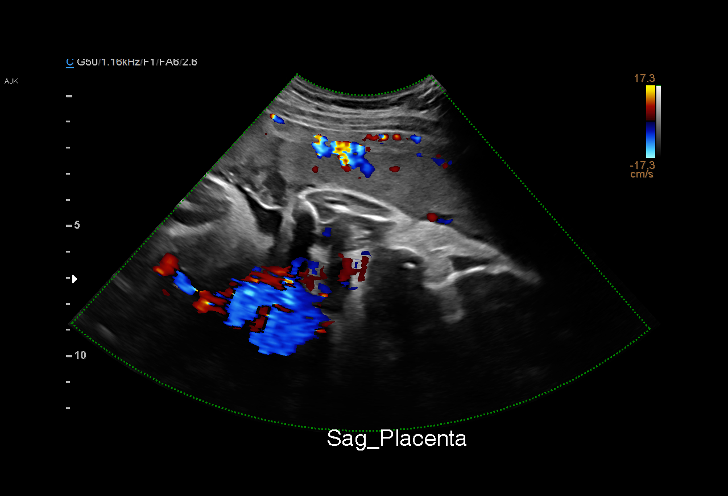
[im 24/46]
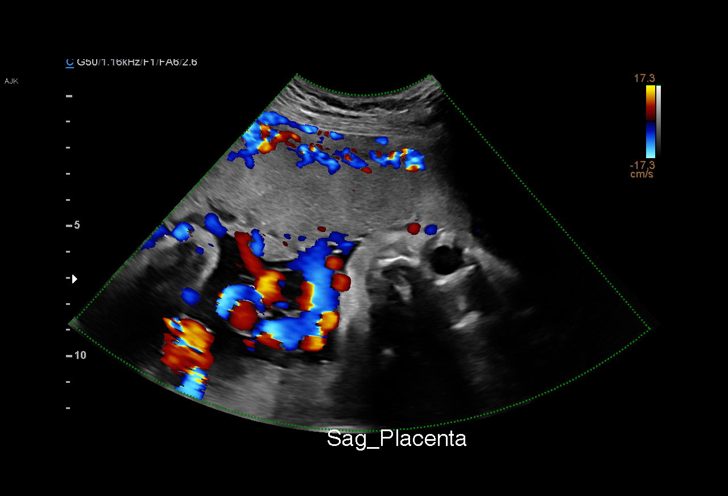
[im 26/46]
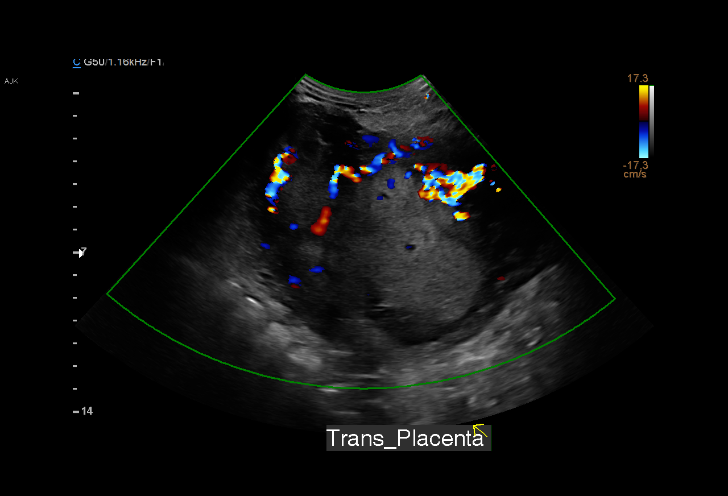
[im 29/46]
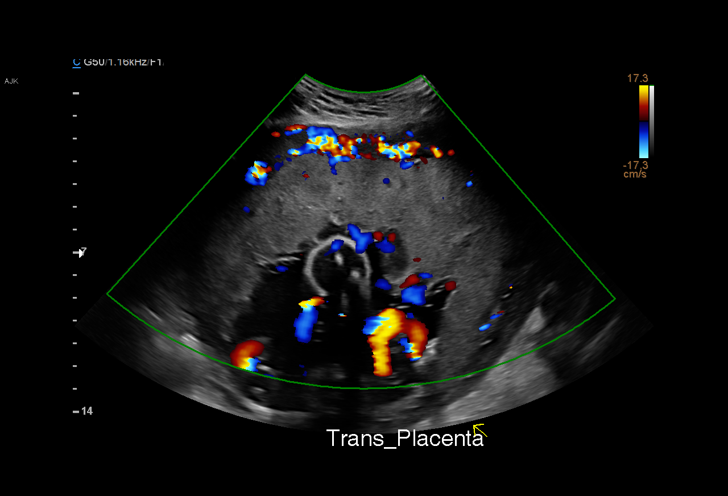
[im 32/46]
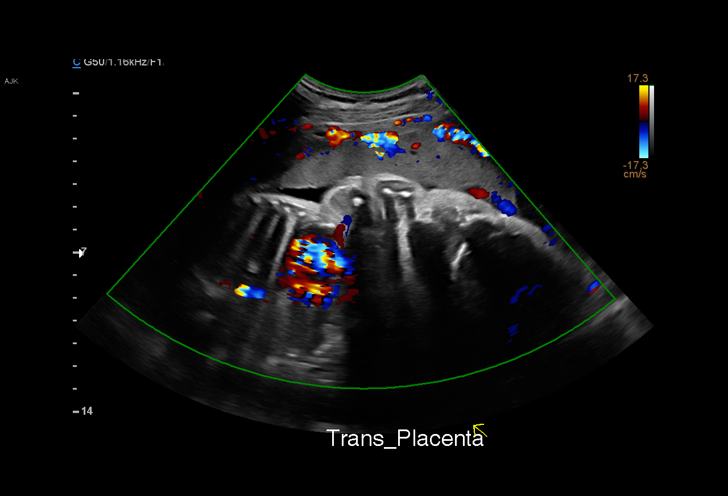
[im 36/46]
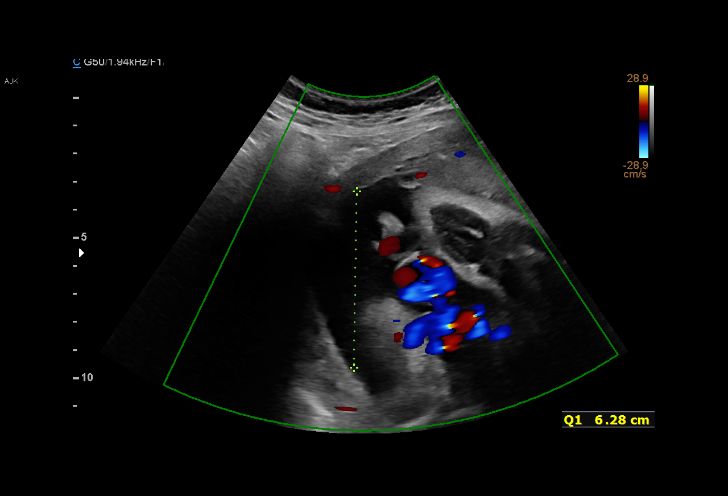
[im 39/46]
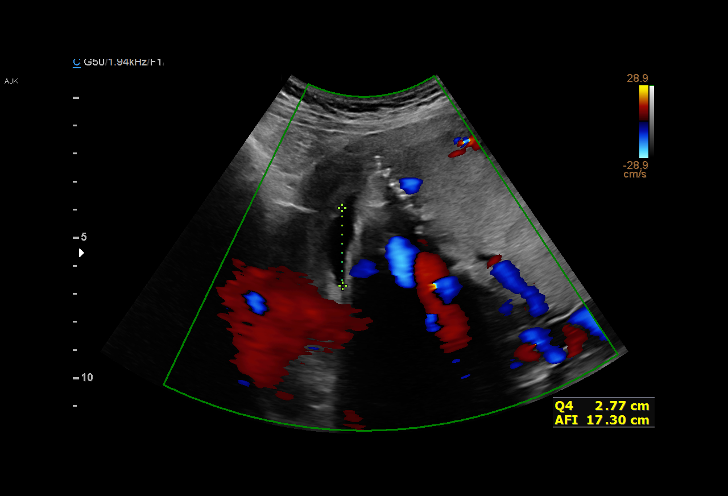
[im 42/46]
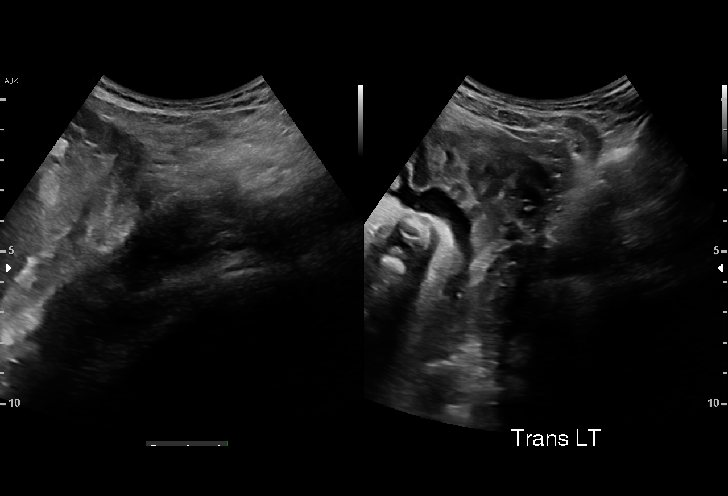
[im 46/46]
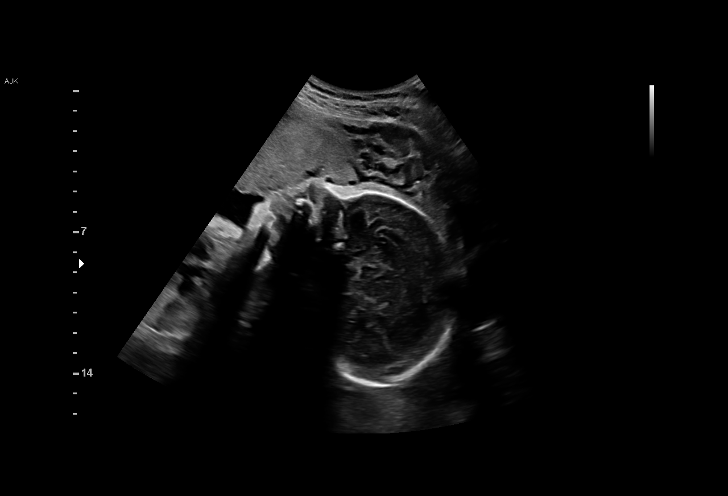

[15 of 28 positions shown; findings below may reference images not displayed]

[place]

 1  US MFM OB LIMITED                     [id number]    JUMPER
                                                      BASALDUA

Indications

 Vaginal bleeding in pregnancy, third trimester
 30 weeks gestation of pregnancy
 Poor obstetric history: Previous preterm
 delivery, antepartum (33 & 35 weeks)
Fetal Evaluation

 Num Of Fetuses:          1
 Fetal Heart Rate(bpm):   155
 Cardiac Activity:        Observed
 Presentation:            Cephalic
 Placenta:                Anterior
 P. Cord Insertion:       Visualized, central

 Amniotic Fluid
 AFI FV:      Within normal limits

 AFI Sum(cm)     %Tile       Largest Pocket(cm)
 17.4            65

 RUQ(cm)       RLQ(cm)       LUQ(cm)        LLQ(cm)


 Comment:    No placental abruption or previa identified. Appropriate fetal
             movement and breathing noted.
OB History

 Gravidity:    5         Term:   2        Prem:   2        SAB:   1
 TOP:          1       Ectopic:  0        Living: 2
Gestational Age
 LMP:           30w 3d        Date:  12/16/20                 EDD:   09/22/21
 Best:          30w 3d     Det. By:  LMP  (12/16/20)          EDD:   09/22/21
Cervix Uterus Adnexa

 Cervix
 Not visualized (advanced GA >26wks)

 Uterus
 No abnormality visualized.

 Right Ovary
 Within normal limits.

 Left Ovary
 Not visualized.

 Adnexa
 No abnormality visualized.
Comments

 This patient presented to the [facility] due to vaginal bleeding.

 A limited ultrasound performed today shows that the fetus is
 in the vertex presentation.

 There was normal amniotic fluid noted.

 A normal-appearing anterior placenta is noted.  There were
 no signs of retroplacental clots noted today.

 Due to the vaginal bleeding, she is receiving a complete
 course of antenatal corticosteroids.
